# Patient Record
Sex: Male | Born: 1940 | Race: White | Hispanic: No | Marital: Married | Smoking: Former smoker
Health system: Southern US, Community
[De-identification: ages and names within clinical notes are randomized; demographics above are authoritative.]

## PROBLEM LIST (undated history)

## (undated) DIAGNOSIS — F0390 Unspecified dementia without behavioral disturbance: Secondary | ICD-10-CM

## (undated) DIAGNOSIS — E785 Hyperlipidemia, unspecified: Secondary | ICD-10-CM

## (undated) DIAGNOSIS — K219 Gastro-esophageal reflux disease without esophagitis: Secondary | ICD-10-CM

## (undated) DIAGNOSIS — F0280 Dementia in other diseases classified elsewhere without behavioral disturbance: Secondary | ICD-10-CM

## (undated) DIAGNOSIS — M1990 Unspecified osteoarthritis, unspecified site: Secondary | ICD-10-CM

## (undated) DIAGNOSIS — Z87891 Personal history of nicotine dependence: Secondary | ICD-10-CM

## (undated) DIAGNOSIS — I2510 Atherosclerotic heart disease of native coronary artery without angina pectoris: Secondary | ICD-10-CM

## (undated) DIAGNOSIS — G709 Myoneural disorder, unspecified: Secondary | ICD-10-CM

## (undated) DIAGNOSIS — I2119 ST elevation (STEMI) myocardial infarction involving other coronary artery of inferior wall: Secondary | ICD-10-CM

## (undated) HISTORY — DX: Atherosclerotic heart disease of native coronary artery without angina pectoris: I25.10

## (undated) HISTORY — PX: BELPHAROPTOSIS REPAIR: SHX369

## (undated) HISTORY — DX: Personal history of nicotine dependence: Z87.891

## (undated) HISTORY — DX: Hyperlipidemia, unspecified: E78.5

## (undated) HISTORY — PX: TONSILLECTOMY: SUR1361

## (undated) HISTORY — DX: ST elevation (STEMI) myocardial infarction involving other coronary artery of inferior wall: I21.19

---

## 2007-05-04 HISTORY — PX: CARDIAC CATHETERIZATION: SHX172

## 2007-06-04 HISTORY — PX: BACK SURGERY: SHX140

## 2007-06-28 ENCOUNTER — Observation Stay (HOSPITAL_COMMUNITY): Admission: RE | Admit: 2007-06-28 | Discharge: 2007-06-28 | Payer: Self-pay | Admitting: Neurosurgery

## 2007-09-06 ENCOUNTER — Inpatient Hospital Stay (HOSPITAL_COMMUNITY): Admission: EM | Admit: 2007-09-06 | Discharge: 2007-09-11 | Payer: Self-pay | Admitting: Emergency Medicine

## 2007-09-06 ENCOUNTER — Ambulatory Visit: Payer: Self-pay | Admitting: *Deleted

## 2007-09-22 ENCOUNTER — Encounter (HOSPITAL_COMMUNITY): Admission: RE | Admit: 2007-09-22 | Discharge: 2007-12-21 | Payer: Self-pay | Admitting: Cardiology

## 2007-10-04 ENCOUNTER — Inpatient Hospital Stay (HOSPITAL_COMMUNITY): Admission: AD | Admit: 2007-10-04 | Discharge: 2007-10-05 | Payer: Self-pay | Admitting: Cardiology

## 2007-12-20 ENCOUNTER — Encounter: Admission: RE | Admit: 2007-12-20 | Discharge: 2007-12-20 | Payer: Self-pay | Admitting: Neurosurgery

## 2007-12-22 ENCOUNTER — Encounter (HOSPITAL_COMMUNITY): Admission: RE | Admit: 2007-12-22 | Discharge: 2008-01-29 | Payer: Self-pay | Admitting: Cardiology

## 2008-04-12 ENCOUNTER — Encounter: Admission: RE | Admit: 2008-04-12 | Discharge: 2008-04-12 | Payer: Self-pay | Admitting: Neurosurgery

## 2010-01-07 ENCOUNTER — Ambulatory Visit: Payer: Self-pay | Admitting: Cardiology

## 2010-01-09 IMAGING — CR DG CHEST 1V PORT
1 series · 1 of 1 positions shown · non-contrast
Comparison: None available

CLINICAL DATA: Myocardial infarction

PORTABLE CHEST - 1 VIEW

[view not recorded]
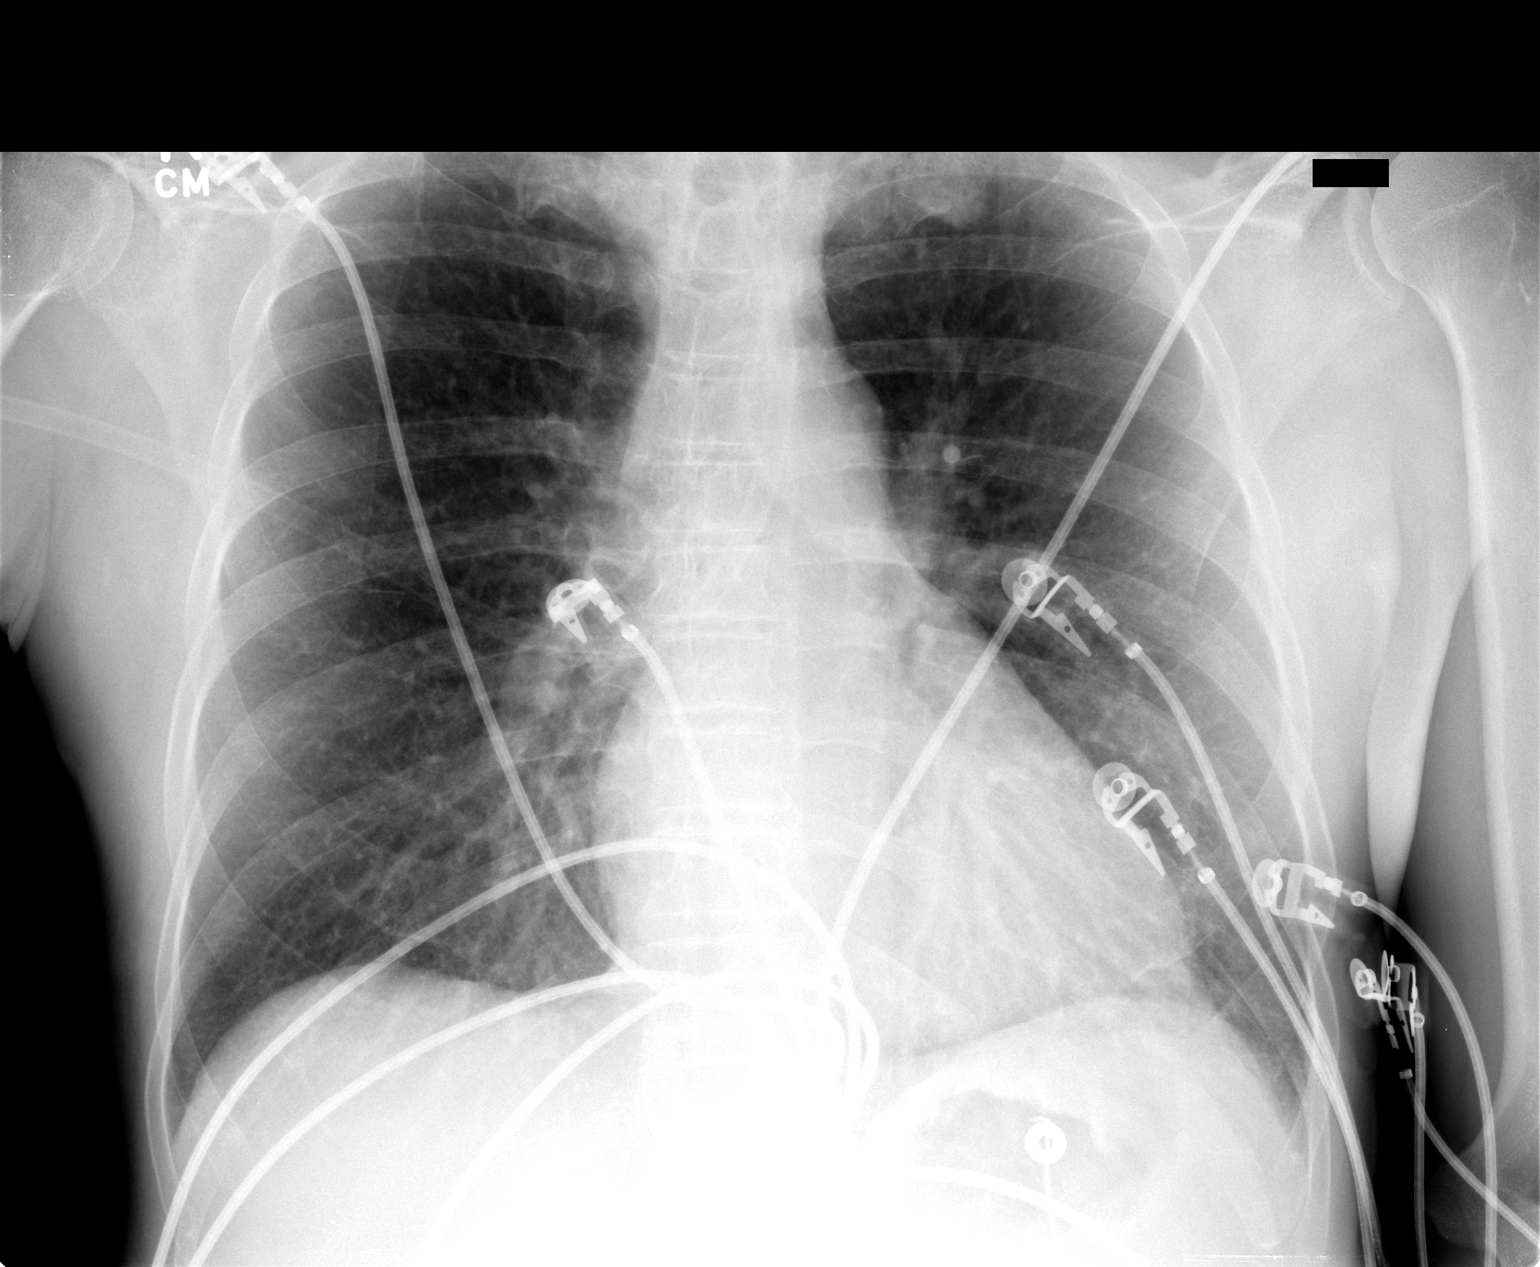

[1 of 1 positions shown; findings below may reference images not displayed]

FINDINGS: Lungs clear.  Heart size and pulmonary vascularity
normal.  No effusion.  Visualized bones unremarkable.]
IMPRESSION: [1.  No acute disease

## 2010-07-08 ENCOUNTER — Ambulatory Visit (INDEPENDENT_AMBULATORY_CARE_PROVIDER_SITE_OTHER): Payer: 59 | Admitting: Cardiology

## 2010-07-08 DIAGNOSIS — I2510 Atherosclerotic heart disease of native coronary artery without angina pectoris: Secondary | ICD-10-CM

## 2010-07-21 ENCOUNTER — Ambulatory Visit (HOSPITAL_COMMUNITY): Payer: 59 | Attending: Cardiology | Admitting: Radiology

## 2010-07-21 VITALS — Ht 68.0 in | Wt 152.0 lb

## 2010-07-21 DIAGNOSIS — I2510 Atherosclerotic heart disease of native coronary artery without angina pectoris: Secondary | ICD-10-CM | POA: Insufficient documentation

## 2010-07-21 NOTE — Progress Notes (Deleted)
[facility] SITE 3 NUCLEAR MED [address] [place] [address] [phone]  Cardiology Nuclear Med Study Alexander [name] male February 15, 1941   Nuclear Med Background Indication for Stress Test:  {CHL INDICATION STRESS TEST:21021011} History:  {CHL HISTORY STRESS TEST:21021012} Cardiac Risk Factors: {CHL CARDIAC RISK FACTORS STRESS TEST:21021014}  Symptoms:  {CHL SYMPTOMS STRESS TEST:21021013}   Nuclear Pre-Procedure Caffeine/Decaff Intake:  {CHL CAFFEINE/DECAFF INTAKE NUC:21021108} NPO After: {CHL AMB NPO TIMES:21021015}   Lungs:  *** IV 0.9% NS with Angio Cath:  {CHL IV 0.9% WITH ANGIO CATH NUC:21021042}  IV Site: {CHL IV SITE STRESS TEST:21021077}  IV Started by:  {CHL LB STRESS TEST IV STARTED BY:21021016}  Chest Size (in):  *** Cup Size:   ***  Height: 5\' 8"  (1.727 m)  Weight:  152 lb (68.947 kg)  BMI:  Body mass index is 23.11 kg/(m^2). Tech Comments:  ***    Nuclear Med Study 1 or 2 day study: 1 day  Stress Test Type:  Treadmill/Lexiscan  Reading MD: Olga Millers, MD  Order Authorizing Provider:  Roger Shelter, MD  Resting Radionuclide: Technetium 68m Tetrofosmin  Resting Radionuclide Dose: *** mCi   Stress Radionuclide:  Technetium 100m Tetrofosmin  Stress Radionuclide Dose: *** mCi           Stress Protocol Rest HR: *** Stress HR: ***  Rest BP: *** Stress BP: ***  Exercise Time:  *** min METS: ***  Predicted HR: *** % of Maximum: ***        Dose of Adenosine:  *** mg Dose of Lexiscan:  *** mg  Dose of Atropine:  *** mg Dose of Dobutamine:  *** mcg/kg/min (at max HR)  Stress Test Technologist: {CHL LB STRESS TEST TECHNOLOGIST:21021024}  Nuclear Technologist:  {CHL LB NUCLEAR TECHNOLOGIST:21021025}     Rest Procedure:  {CHL REST PROCEDURE NUCLEAR:21021027} Rest ECG: {CHL REST NFA:21308}  Stress Procedure:  {CHL STRESS PROCEDURE NUCLEAR:21021028} Stress ECG: {CHL CAR STRESS ECG:21561}  QPS Raw Data Images:  {CHL RAW DATA IMAGES  NUC:21021029} Stress Images:  {CHL STRESS IMAGES NUC:21021030} Rest Images:  {CHL REST IMAGES NUC:21021031} Subtraction (SDS):  {CHL SUBTRACTION (SDS) NUC:21021032}  Findings Risk Category:  {CHL RISK CATEGORY NUC:21021033} Clinically Abnormal:  {CHL FINDINGS CLINICALLY ABNORMAL NUC:21021035} Ischemia:  {CHL ISCHEMIA NUC:21021043} Fixed Defect:  {CHL FIXED DEFECT NUC:21021044} LV Dysfunction:  {CHL LV DYSFUNCTION NUC:21021045} Transient Ischemic Dilatation (Normal <1.22):  *** Lung/Heart Ratio (Normal <0.45):  ***  Quantitative Gated Spect Images QGS EDV:  *** ml QGS ESV:  *** ml QGS cine images:  *** QGS EF:  *** %  Impression Exercise Capacity:  {CHL EXERCISE CAPACITY NUC:21021037} BP Response:  {CHL BP RESPONSE NUC:21021038} Clinical Symptoms:  {CHL CLINICAL SYMPTOMS NUC:21021039} ECG Impression:  {CHL ECG IMPRESSION NUC:21021040} Comparison with Prior Nuclear Study: {CHL NUCLEAR STUDY COMPARISON:21562}  Overall Impression:  {CHL OVERALL IMPRESSION NUC:21021041}

## 2010-07-21 NOTE — Progress Notes (Signed)
[facility] SITE 3 NUCLEAR MED [address] 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  O2 Sat 98% on RA. IV 0.9% NS with Angio Cath:  18g  IV Site: R Antecubital  IV Started by:  Stanton Kidney, EMT-P  Chest Size (in):  40 Cup Size:  NA  Height: 5\' 8"  (1.727 m)  Weight:  152 lb (68.947 kg)  BMI:  Body mass index is 23.11 kg/(m^2). Tech Comments: Metoprolol held this am, per patient.    Nuclear Med Study 1 or 2 day study: 1 day  Stress Test Type:  Treadmill/Lexiscan  Reading MD: Charlton Haws, MD  Order Authorizing Provider:  Roger Shelter, MD  Resting Radionuclide: Technetium 72m Tetrofosmin  Resting Radionuclide Dose: 11 mCi   Stress Radionuclide:  Technetium 29m Tetrofosmin  Stress Radionuclide Dose: 33 mCi           Stress Protocol Rest HR: 66 Stress HR:120  Rest BP:133/85 Stress BP: 126/84  Exercise Time:  2 min METS: NA  Predicted HR: NA % of Maximum: NA    Predicted Max HR: 152 bpm % Max HR: 78.95 bpm Rate Pressure Product: 29937    Dose of Adenosine:  NA Dose of Lexiscan:  0.4 mg  Dose of Atropine:  NA Dose of Dobutamine:  NA  Stress Test Technologist: [employer], CMA-N  Nuclear Technologist:  Domenic Polite, CNMT     Rest Procedure:  Myocardial perfusion imaging was performed at rest 45 minutes following the intravenous administration of Technetium 4m Tetrofosmin. Rest ECG: No acute changes.  Stress Procedure:  The patient received IV Lexiscan 0.4 mg over 15-seconds with concurrent low level exercise and then  Technetium 63m Tetrofosmin was injected at 30-seconds.  There were no significant changes with Lexiscan; only occasional PVC's.  He did c/o chest pressure.  Quantitative spect images were obtained after a 45-minute delay. Stress ECG: No significant change from baseline ECG  QPS Raw Data Images:  Motion artifact Stress Images:  There is decreased uptake in the inferior wall. Rest Images:  There is decreased uptake in the inferior wall. Subtraction (SDS):  Normal  Findings Risk Category:  Low risk nuclear study. Clinically Abnormal:  Chest Pain Ischemia:  No Fixed Defect:  Small inferobasal wall infarct with no ischemia LV Dysfunction:  No Transient Ischemic Dilatation (Normal <1.22):  .96 Lung/Heart Ratio (Normal <0.45):  .28  Quantitative Gated Spect Images QGS EDV:  74 ml QGS ESV:  28ml QGS cine images:  No wall motion abnormalities QGS EF:  62 %  Impression Exercise Capacity:  Lexiscan with low level exercise. BP Response:  Normal blood pressure response. Clinical Symptoms:  Significant chest pain. ECG Impression:  No significant ST segment change suggestive of ischemia. Comparison with Prior Nuclear Study: Old study not available  Overall Impression:  Low risk stress nuclear study.

## 2010-08-12 ENCOUNTER — Telehealth: Payer: Self-pay | Admitting: *Deleted

## 2010-08-12 NOTE — Telephone Encounter (Signed)
Pt notified of Lori's instructions to take only 1/2 tablet of Crestor 20 mg and f/u in a few months with Dr. Elease Hashimoto.  Pt will call if symptoms worsen.

## 2010-08-12 NOTE — Telephone Encounter (Signed)
He may cut back to just 1/2 tablet if he would like for the next couple of months.

## 2010-08-12 NOTE — Telephone Encounter (Signed)
Pt c/o to RN of having more memory problems and pt feels like it may be r/t Crestor 20 mg.  Pt would like to know what to do?

## 2010-08-18 ENCOUNTER — Other Ambulatory Visit (HOSPITAL_COMMUNITY): Payer: Self-pay | Admitting: Neurosurgery

## 2010-08-18 DIAGNOSIS — M545 Low back pain, unspecified: Secondary | ICD-10-CM

## 2010-08-18 DIAGNOSIS — M4310 Spondylolisthesis, site unspecified: Secondary | ICD-10-CM

## 2010-08-18 DIAGNOSIS — M48061 Spinal stenosis, lumbar region without neurogenic claudication: Secondary | ICD-10-CM

## 2010-08-18 DIAGNOSIS — IMO0002 Reserved for concepts with insufficient information to code with codable children: Secondary | ICD-10-CM

## 2010-08-21 ENCOUNTER — Ambulatory Visit (HOSPITAL_COMMUNITY)
Admission: RE | Admit: 2010-08-21 | Discharge: 2010-08-21 | Disposition: A | Discharge status: Home / Self Care | Payer: Medicare Other | Source: Ambulatory Visit | Attending: Neurosurgery | Admitting: Neurosurgery

## 2010-08-21 DIAGNOSIS — M519 Unspecified thoracic, thoracolumbar and lumbosacral intervertebral disc disorder: Secondary | ICD-10-CM | POA: Insufficient documentation

## 2010-08-21 DIAGNOSIS — M4310 Spondylolisthesis, site unspecified: Secondary | ICD-10-CM

## 2010-08-21 DIAGNOSIS — M545 Low back pain, unspecified: Secondary | ICD-10-CM | POA: Insufficient documentation

## 2010-08-21 DIAGNOSIS — M48061 Spinal stenosis, lumbar region without neurogenic claudication: Secondary | ICD-10-CM | POA: Insufficient documentation

## 2010-08-21 DIAGNOSIS — IMO0002 Reserved for concepts with insufficient information to code with codable children: Secondary | ICD-10-CM

## 2010-08-21 DIAGNOSIS — M5126 Other intervertebral disc displacement, lumbar region: Secondary | ICD-10-CM | POA: Insufficient documentation

## 2010-08-21 LAB — CREATININE, SERUM
Creatinine, Ser: 0.95 mg/dL (ref 0.4–1.5)
GFR calc Af Amer: >60 mL/min (ref >60)
GFR calc non Af Amer: >60 mL/min (ref >60)

## 2010-08-21 LAB — BUN: BUN: 13 mg/dL (ref 6–23)

## 2010-08-21 MED ORDER — GADOBENATE DIMEGLUMINE 529 MG/ML IV SOLN
15.0000 mL | Freq: Once | INTRAVENOUS | Status: AC
  Administered 2010-08-21: 15 mL via INTRAVENOUS

## 2010-08-28 ENCOUNTER — Telehealth: Payer: Self-pay | Admitting: Cardiology

## 2010-08-28 NOTE — Telephone Encounter (Signed)
Is having surgery on wed. 5/2 at [facility] for L 5 ruputed disk

## 2010-08-28 NOTE — Telephone Encounter (Signed)
Dr. Deborah Chalk notified of upcoming back surgery and states pt is ok for back surgery.  Pt notified.

## 2010-08-28 NOTE — Telephone Encounter (Signed)
Pt is ok for surgery per Dr. Deborah Chalk.

## 2010-09-01 ENCOUNTER — Encounter (HOSPITAL_COMMUNITY)
Admission: RE | Admit: 2010-09-01 | Discharge: 2010-09-01 | Disposition: A | Discharge status: Home / Self Care | Payer: Medicare Other | Source: Ambulatory Visit | Attending: Neurosurgery | Admitting: Neurosurgery

## 2010-09-01 ENCOUNTER — Ambulatory Visit (HOSPITAL_COMMUNITY)
Admission: RE | Admit: 2010-09-01 | Discharge: 2010-09-01 | Disposition: A | Discharge status: Home / Self Care | Payer: Medicare Other | Source: Ambulatory Visit | Attending: Neurosurgery | Admitting: Neurosurgery

## 2010-09-01 ENCOUNTER — Other Ambulatory Visit (HOSPITAL_COMMUNITY): Payer: Self-pay | Admitting: Neurosurgery

## 2010-09-01 DIAGNOSIS — Z01810 Encounter for preprocedural cardiovascular examination: Secondary | ICD-10-CM | POA: Insufficient documentation

## 2010-09-01 DIAGNOSIS — Z87891 Personal history of nicotine dependence: Secondary | ICD-10-CM | POA: Insufficient documentation

## 2010-09-01 DIAGNOSIS — Z01818 Encounter for other preprocedural examination: Secondary | ICD-10-CM | POA: Insufficient documentation

## 2010-09-01 DIAGNOSIS — IMO0002 Reserved for concepts with insufficient information to code with codable children: Secondary | ICD-10-CM

## 2010-09-01 DIAGNOSIS — Z01812 Encounter for preprocedural laboratory examination: Secondary | ICD-10-CM | POA: Insufficient documentation

## 2010-09-01 DIAGNOSIS — I10 Essential (primary) hypertension: Secondary | ICD-10-CM | POA: Insufficient documentation

## 2010-09-01 LAB — BASIC METABOLIC PANEL
BUN: 19 mg/dL (ref 6–23)
CO2: 29 mEq/L (ref 19–32)
Calcium: 10 mg/dL (ref 8.4–10.5)
Chloride: 103 mEq/L (ref 96–112)
Creatinine, Ser: 0.96 mg/dL (ref 0.4–1.5)
GFR calc Af Amer: >60 mL/min (ref >60)
GFR calc non Af Amer: >60 mL/min (ref >60)
Glucose, Bld: 102 mg/dL — ABNORMAL HIGH (ref 70–99)
Potassium: 4.4 mEq/L (ref 3.5–5.1)
Sodium: 139 mEq/L (ref 135–145)

## 2010-09-01 LAB — CBC
HCT: 45.3 % (ref 39.0–52.0)
Hemoglobin: 15.8 g/dL (ref 13.0–17.0)
MCH: 32 pg (ref 26.0–34.0)
MCHC: 34.9 g/dL (ref 30.0–36.0)
MCV: 91.9 fL (ref 78.0–100.0)
Platelets: 239 10*3/uL (ref 150–400)
RBC: 4.93 MIL/uL (ref 4.22–5.81)
RDW: 13.4 % (ref 11.5–15.5)
WBC: 6.8 10*3/uL (ref 4.0–10.5)

## 2010-09-01 LAB — SURGICAL PCR SCREEN
MRSA, PCR: NEGATIVE
Staphylococcus aureus: POSITIVE — AB

## 2010-09-02 ENCOUNTER — Inpatient Hospital Stay (HOSPITAL_COMMUNITY)
Admission: RE | Admit: 2010-09-02 | Discharge: 2010-09-03 | DRG: 491 | Disposition: A | Discharge status: Home / Self Care | Payer: Medicare Other | Source: Ambulatory Visit | Attending: Neurosurgery | Admitting: Neurosurgery

## 2010-09-02 ENCOUNTER — Inpatient Hospital Stay (HOSPITAL_COMMUNITY): Payer: Medicare Other

## 2010-09-02 DIAGNOSIS — M51379 Other intervertebral disc degeneration, lumbosacral region without mention of lumbar back pain or lower extremity pain: Secondary | ICD-10-CM | POA: Diagnosis present

## 2010-09-02 DIAGNOSIS — Z01810 Encounter for preprocedural cardiovascular examination: Secondary | ICD-10-CM

## 2010-09-02 DIAGNOSIS — I252 Old myocardial infarction: Secondary | ICD-10-CM

## 2010-09-02 DIAGNOSIS — I10 Essential (primary) hypertension: Secondary | ICD-10-CM | POA: Diagnosis present

## 2010-09-02 DIAGNOSIS — Z87891 Personal history of nicotine dependence: Secondary | ICD-10-CM

## 2010-09-02 DIAGNOSIS — M47817 Spondylosis without myelopathy or radiculopathy, lumbosacral region: Secondary | ICD-10-CM | POA: Diagnosis present

## 2010-09-02 DIAGNOSIS — I2510 Atherosclerotic heart disease of native coronary artery without angina pectoris: Secondary | ICD-10-CM | POA: Diagnosis present

## 2010-09-02 DIAGNOSIS — M5126 Other intervertebral disc displacement, lumbar region: Principal | ICD-10-CM | POA: Diagnosis present

## 2010-09-02 DIAGNOSIS — Z9861 Coronary angioplasty status: Secondary | ICD-10-CM

## 2010-09-02 DIAGNOSIS — M5137 Other intervertebral disc degeneration, lumbosacral region: Secondary | ICD-10-CM | POA: Diagnosis present

## 2010-09-02 DIAGNOSIS — Z01812 Encounter for preprocedural laboratory examination: Secondary | ICD-10-CM

## 2010-09-07 NOTE — Op Note (Signed)
Alexander [name], Alexander [name]             ACCOUNT NO.:  [id number]  MEDICAL RECORD NO.:  [id number]           PATIENT TYPE:  I  LOCATION:  3534                         FACILITY:  [facility]  PHYSICIAN:  Hewitt Shorts, M.D.DATE OF BIRTH:  1941-01-04  DATE OF PROCEDURE:  09/02/2010 DATE OF DISCHARGE:                              OPERATIVE REPORT   PREOPERATIVE DIAGNOSES:  Right L5-S1 lumbar disk herniation, lumbar degenerative disk disease, lumbar spondylosis, and lumbar radiculopathy.  POSTOPERATIVE DIAGNOSES:  Right L5-S1 lumbar disk herniation, lumbar degenerative disk disease, lumbar spondylosis, and lumbar radiculopathy.  PROCEDURE:  Right L5-S1 lumbar laminotomy and microdiskectomy with microdissection and the operating microscope.  SURGEON:  Hewitt Shorts, MD.  ASSISTANT:  Hilda Lias, MD.  ANESTHESIA:  General endotracheal.  INDICATIONS:  The patient is a 70 year old man, who is status post previous right L3-4 extraforaminal microdiskectomy number of years ago. He has been having some low back difficulties, who is undergoing periodic epidural steroid injection, but he has had worsening right lumbar radicular pain over the past few months.  He was restudied with MRI scan couple of weeks ago, which showed large right L5-S1 lumbar disk herniation with significant thecal sac and nerve root compression. Decision was made to proceed with laminotomy and microdiskectomy.  DESCRIPTION OF PROCEDURE:  The patient was brought to the operating room and placed on general endotracheal anesthesia.  The patient was turned to prone position.  Lumbar region was prepped with Betadine soap solution and draped in sterile fashion.  The midline incision was infiltrated with local anesthetic with epinephrine.  X-ray was taken and the L5-S1 level identified.  The midline incision was made over the L5- S1 level and carried down through the subcutaneous tissue.  Bipolar cautery and  electrocautery was used to maintain hemostasis.  Dissection was carried down to lumbar fascia which was incised on the right side of the midline and the paraspinal muscles were dissected from the spinous process and lamina in subperiosteal fashion.  X-rays were taken and the L5-S1 interlaminar space was identified.  Then, the operating microscope was then draped and brought into the field to provide additional navigation, illumination, and visualization.  The remainder of the decompression was performed using microdissection and microsurgical technique.  Laminotomy was performed using the high-speed drill and Kerrison punches.  The edges of the bone were waxed as needed to establish hemostasis.  Medial facetectomy was performed and then we carefully removed the ligamentum flavum, identified the thecal sac and right S1 nerve root.  Ventral to the nerve root, there was a large disk fragment significantly posteriorly displacing the right S1 nerve root. It was enmeshed in ventral epidural tissues.  These were opened and the disk herniation removed in a piecemeal fashion.  There is a large disk herniation and we were able to remove all of the loose fragments with good decompression of the thecal sac and nerve root.  We explored the epidural space thoroughly.  All loose fragments of disk material had been successfully removed.  Epidural bleeding was controlled with bipolar cautery as needed, and although we saw a small rent in the inferior aspect of  the L5-S1 annulus, it was not felt that there was any significant disk bulging, and therefore there was no advantage to proceeding to enter into the disk space and continued diskectomy in the disk space, and so good decompression had been achieved by removing the herniated fragments.  It was irrigated with bacitracin solution. Hemostasis was established and confirmed.  We examined the epidural space another time.  No further fragments were found and  then we instilled 2 mL of fentanyl and 80 mg of Depo-Medrol into the epidural space and proceed with closure.  The deep fascia closed with interrupted undyed #1 Vicryl sutures.  Scarpa fascia was closed with interrupted undyed #1 Vicryl sutures.  The subcutaneous and subcuticular were closed with interrupted inverted 2-0 running Vicryl sutures.  The skin was approximated with Dermabond.  The procedure was tolerated well.  The estimated blood loss was 25 mL.  Sponge and needle count were correct. Following surgery, the patient was turned back to supine position to be reversed from the anesthetic, extubated, and transferred to the recovery room for further care.     Hewitt Shorts, M.D.     RWN/MEDQ  D:  09/02/2010  T:  09/03/2010  Job:  [id number]  Electronically Signed by Shirlean Kelly M.D. on 09/07/2010 12:08:06 PM

## 2010-09-15 NOTE — Op Note (Signed)
NAMEBRYSE, BLANCHETTE NO.:  [id number]   MEDICAL RECORD NO.:  [id number]          PATIENT TYPE:  INP   LOCATION:  3599                         FACILITY:  [facility]   PHYSICIAN:  Hewitt Shorts, M.D.DATE OF BIRTH:  1941/03/03   DATE OF PROCEDURE:  06/28/2007  DATE OF DISCHARGE:                               OPERATIVE REPORT   PREOPERATIVE DIAGNOSES:  1. Right L3-4 extraforaminal lumbar disk herniation.  2. Lumbar spondylosis.  3. Lumbar degenerative disk disease and lumbar radiculopathy.   POSTOPERATIVE DIAGNOSES:  1. Right L3-4 extraforaminal lumbar disk herniation.  2. Lumbar spondylosis.  3. Lumbar degenerative disk disease and lumbar radiculopathy.   PROCEDURE:  Right L3-4 extraforaminal microdiskectomy with  microdissection.   SURGEON:  Hewitt Shorts, M.D.   ASSISTANT:  Danae Orleans. Venetia Maxon, M.D.   ANESTHESIA:  General endotracheal.   INDICATIONS:  The patient is a 70 year old man who presented with right  lumbar radiculopathy secondary to a large right L3-4 extraforaminal disk  herniation.  Decision was made to proceed with elective diskectomy.   PROCEDURE PERFORMED:  The patient was brought to the operating room and  placed under general endotracheal anesthesia.  The patient was turned to  a prone position.  Lumbar region was prepped with Betadine soap and  solution and draped in a sterile fashion.  The midline was infiltrated  with local anesthetic with epinephrine and x-ray was taken.  The L3-4  level was identified.  A midline incision was made over the L3-4 level  and carried down through the subcutaneous tissue.  Bipolar cautery and  electrocautery were used to maintain hemostasis.  Dissection was carried  down to the lumbar fascia, which was incised on the right side of the  midline.  The paraspinal muscles were dissected from the spinous process  and lamina in a subperiosteal fashion.  Dissection was carried out  laterally over the facets.   A self-retaining retractor was placed.  An  an x-ray was taken and we identified the L3-4 extraforaminal space.  The  microscope was draped and brought to the field to provide additional  navigation, illumination, and visualization.  The remainder of the  decompression was performed using microdissection and microsurgical  technique.  We carefully removed the intertransverse muscle and then  fascia.  There was substantial hypertrophic overgrowth of the facet  component at L2-3 and L3-4 and using the XMax drill we removed some of  this hypertrophic bone and we were able to dissect medially to remove  the intertransverse ligament.  We identified what we suspected was the  nerve root.  It appeared to be bulging towards [place].  We worked  inferomedial to the nerve root and identified the disk herniation and  began to remove this in a piecemeal fashion.  Several large fragments  were removed and with that, the nerve roots no longer appeared  compressed and displaced and was much more loose and slack.  We  continued to explore the ventral aspect of the extraforaminal space as  well as more medially into the lateral aspect of the nerve root foramen  and all  loose fragments of disk material were removed.  We did not enter  into the disk space itself, but just removed the free fragments of the  disk that has herniated and compressed the nerve root.  All loose  fragments were removed and good decompression of the nerve root was  achieved.  We then established hemostasis with the use of Gelfoam soaked  with thrombin.  The Gelfoam was removed prior to closure.  Once  hemostasis had been established, we instilled 2 mL of fentanyl and 80 mg  of Depo-Medrol into the extraforaminal space around the nerve root and  dorsal root ganglion and then proceeded with closure.  The deep fascia  was closed with interrupted, undyed #1 Vicryl suture.  The subcutaneous  and subcuticular layers closed with interrupted  inverted 2-0 and 3-0  undyed Vicryl sutures.  The skin was re-approximated with Dermabond.  Estimated blood loss was 10 mL.  The needle count was correct.  The  sponge count was correct.  However, the cottonoid patty count was  incorrect.  We had the correct number of 1 x 1 patties and 1/2 x 1/2  patties, but had one missing 1/2 x 1 patty.  We thoroughly examined the  draped out field and tables.  We did find one cottonoid patty string and  we found a 1/2 x 1 patty on the floor although we could not actually  confirm that it was from this case.  We, therefore, did AP and lateral  lumbar spine x-rays and examined around the surgical site.  On the x-  ray, no evidence of radiodense foreign body was seen.  The x-rays were  reviewed by Dr. Amil Amen from [facility] as well and  he similarly found no evidence of a radiodense foreign body and  therefore, we proceeded with reversing the patient and awakening him  from anesthetic.  He was turned to a supine position, reversed from the  anesthetic, extubated, and transferred to the recovery room for further  care.   Notably during the case, I had only used two 1 x 1 patties in the  surgical field during the case and between the x-rays and this fact, we  do not believe that there is a retained cottonoid patty in the patient.      Hewitt Shorts, M.D.  Electronically Signed     RWN/MEDQ  D:  06/28/2007  T:  06/28/2007  Job:  [id number]

## 2010-09-15 NOTE — H&P (Signed)
NAMEJANI, Alexander [name] NO.:  [id number]   MEDICAL RECORD NO.:  [id number]          PATIENT TYPE:  INP   LOCATION:  1824                         FACILITY:  [facility]   PHYSICIAN:  Unice Cobble, MD     DATE OF BIRTH:  03/27/1941   DATE OF ADMISSION:  09/06/2007  DATE OF DISCHARGE:                              HISTORY & PHYSICAL   CHIEF COMPLAINT:  Chest pain.   HISTORY OF THE PRESENT ILLNESS:  This is a 70 year old white male with a  history of hyperlipidemia who had the onset of chest pain 30 minutes  prior to arrival associated with diaphoresis, nausea and shortness of  breath.  The pain radiated down his left arm.  Three sublingual  nitroglycerin and aspirin were given the ambulance with mild benefit.  EKG showed ST elevation inferiorly and the pain is now 3-4/10 in the  emergency room.   PAST MEDICAL AND SURGICAL HISTORY:  1. Hyperlipidemia.  2. Back surgery in February 2009.   ALLERGIES:  No known drug allergies.   MEDICATIONS:  None.   SOCIAL HISTORY:  The patient smokes less than a pack/day, but has smoked  up to a pack per day for years with numerous quittings; he last smoked 3  months ago.  No alcohol or drugs.   FAMILY HISTORY:  The patient's mother had no heart disease.  His father  had a heart attack at the age of 41.   REVIEW OF SYSTEMS:  Complete review of systems is done and found to be  otherwise negative, except as stated in the HPI.   PHYSICAL EXAMINATION:  VITAL SIGNS:  Temperature; he is afebrile.  Pulse  is 38 increasing to 81 with 0.5 mg of atropine.  Respiratory rate is 20.  Blood pressure is 90/48.  Oxygen saturations are 100% on 100% face mask.  GENERAL APPEARANCE:  In general he is a thin white male in no acute  distress.  HEENT:  PERRLA.  EOMI.  Moist mucous membranes.  Oropharynx is without  erythema or exudates.  NECK:  The neck is supple without lymphadenopathy, thyromegaly, bruits  or jugular venous distention.  HEART:  The  heart has a regular rate and rhythm without murmurs, gallops  or rubs.  Normal S1 and S2.  LUNGS:  The lungs are clear to auscultation bilaterally without wheezes,  rhonchi or rales.  ABDOMEN:  The abdomen is soft and nontender with normal bowel sounds,  and without guarding.  No hepatosplenomegaly.  EXTREMITIES:  The extremities show no cyanosis, clubbing or edema.  MUSCULOSKELETAL:  The musculoskeletal examination is normal.  NEUROLOGIC EXAMINATION:  Neurologically he is alert and oriented x3 with  no gross deficits.   LABORATORY DATA:  The EKG shows a rate of 38 in sinus bradycardia with 2-  3 mm of ST elevation inferiorly with ST depression and T wave inversions  in 1 and aVL.   ASSESSMENT AND PLAN:  This is a 70 year old white male with inferior ST  elevation myocardial infarction; will urgently go to the catheterization  laboratory.  Atropine as needed for bradycardia.  Heparin 5,000  units  was given.  Aspirin 324 given.  Plavix and 2B3A inhibitor to be  determined in the catheterization laboratory.  Check lipids.      Unice Cobble, MD  Electronically Signed     ACJ/MEDQ  D:  09/06/2007  T:  09/06/2007  Job:  [id number]

## 2010-09-15 NOTE — Cardiovascular Report (Signed)
NAMEMAYUR, [name] NO.:  [id number]   MEDICAL RECORD NO.:  [id number]          PATIENT TYPE:  INP   LOCATION:  2903                         FACILITY:  [facility]   PHYSICIAN:  Colleen Can. Deborah Chalk, M.D.DATE OF BIRTH:  13-Jul-1940   DATE OF PROCEDURE:  DATE OF DISCHARGE:                            CARDIAC CATHETERIZATION   PROCEDURE:  Left heart catheterization with selective coronary  angiography, left ventricular angiography, and percutaneous coronary  intervention with a non-drug-eluting stent to the right coronary artery.   TYPE AND SITE OF ENTRY:  Percutaneous right femoral artery with Angio-  Seal.   CATHETERS:  6-French 4-curved Judkins right and left coronary cath, 6-  French pigtail ventriculographic catheter, JR4 guide, Prowater guide  wire, initially dilated with a 3.0 x 20-mm Maverick balloon.  Subsequently returned with a 3.5 x 20-mm Liberte stent and subsequent  with a 4.0 x 15 Quantum Maverick balloon.   CONTRAST MATERIAL:  Omnipaque.   MEDICATIONS GIVEN DURING THE PROCEDURE:  Heparin, dopamine, and  subsequent Lopressor 5 mg IV.   COMMENTS:  The patient tolerated the procedure well.   HEMODYNAMIC DATA:  The LV was 105/15-25.  Aortic pressure was 115/65.   ANGIOGRAPHIC FINDINGS:  1. The main coronary artery is normal.  2. Left circumflex.  Left circumflex had irregularities of the non-      large vessel.  3. Left anterior descending had 10-20% irregularities in midportion      and then systolic bridging, left anterior descending across the      apex.  4. Intermediate.  The intermediate had somewhat diffuse 20-30%      narrowings.  5. Right coronary artery.  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  There are      left-to-right collaterals distally, although not bridged.   Left ventricular angiogram was performed after the angioplasty.  This  showed that there was mild inferior  hypokinesis.  Global ejection  fraction estimated to be a 55%.   Angioplasty procedure.  The JR-4 guide had satisfactory backup and the  Prowater guide wire easily passed across the stenosis.  We predilated  with a 3.0 x 20-mm Maverick balloon.  We then positioned a 3.5 x 20-mm  Liberte (non-drug-eluting) stent across the stenosis.  We then returned  with a 4.0 x 15-mm Quantum Maverick balloon and dilated to a maximum of  17 atmospheres.  The final angiographic resulted the area of total  occlusion was excellent.  There were some mild irregularities proximal  and distal to the stent, but is felt to be in transition and not related  to any intimal tears.  There was a large distal acute marginal vessel  that extended over to the inferior wall almost in a posterior descending  type manner that had an 80% ostial stenosis.  This may be a potential  target for revascularization at a different time.   OVERALL IMPRESSION:  1. Acute inferior myocardial infarction with successful stent placed      in the right coronary artery.  2.  Well-preserved global left ventricular function.  3. Residual 80% stenosis in an acute marginal vessel/posterior      descending vessel of the right coronary artery with mild      irregularities, otherwise, in the left coronary tree.   DISCUSSION:  The patient will be transferred to CCU.  Overall, it is  felt that the revascularization will results in myocardial salvage.      Colleen Can. Deborah Chalk, M.D.  Electronically Signed     SNT/MEDQ  D:  09/06/2007  T:  09/06/2007  Job:  [id number]   cc:   Miguel Aschoff, M.D.

## 2010-09-15 NOTE — Discharge Summary (Signed)
NAMEBECKETT, Alexander NO.:  [id number]   MEDICAL RECORD NO.:  [id number]          PATIENT TYPE:  INP   LOCATION:  6523                         FACILITY:  [facility]   PHYSICIAN:  Colleen Can. Deborah Chalk, M.D.DATE OF BIRTH:  Sep 30, 1940   DATE OF ADMISSION:  10/04/2007  DATE OF DISCHARGE:  10/05/2007                               DISCHARGE SUMMARY   DISCHARGE DIAGNOSES:  1. Coronary artery disease with subsequent successful cutting balloon      angioplasty to the posterior descending coronary artery.  2. Recent acute inferior myocardial infarction treated with stenting      of the right coronary on Sep 06, 2007 with 3.5 x 20 mm Liberte non-      drug eluting stent.  3. Recent abnormal stress Cardiolite study.  4. Hyperlipidemia.  5. History of tobacco abuse, resolved.   HISTORY OF PRESENT ILLNESS:  Mr. Gorr is a pleasant 70 year old male  who has had a recent inferior MI that was treated with emergency  stenting of the right coronary.  At that time, he had no residual lesion  in a branch portion of the posterior descending.  He has basically done  well since his discharge from the hospital on Sep 11, 2007.  He has had  a followup stress Cardiolite study which demonstrated mild residual  ischemia.  He subsequently presents for plans for attempts for further  revascularization.  Clinically, he has done well with no complaints of  chest pain.   Please see the dictated history and physical for further patient  presentation and profile.   LABORATORY DATA ON ADMISSION:  His EKG was abnormal at rest which showed  previous inferior infarction.  His PT and PTT were unremarkable.  Chemistries were normal.  CBC was normal.   HOSPITAL COURSE:  The patient was admitted electively on October 04, 2007.  He underwent cutting balloon angioplasty to the posterior descending.  Residual stenosis is less than 20%.  He received IV nitroglycerin and  Angiomax and overall the procedure was  tolerated well.  Post procedure,  he was transferred to 6500 and October 05, 2007, he is doing well without  complaints.  Physical exam is unremarkable.  Post procedure labs are  normal and he was felt to be a stable candidate for discharge.   DISCHARGE CONDITION:  Stable.   DISCHARGE DIET:  Low salt, heart-healthy.   WOUND CARE:  He is to use an ice pack specifically to the groin.   He will keep his appointment in our office for next week and certainly  sooner if any problems would arise in the interim.   DISCHARGE MEDICATIONS:  1. Plavix 75 mg a day.  2. Crestor 20 mg a day.  3. Aspirin 325 a day.  4. Lopressor 25 b.i.d.  5. Pepcid 40 at night.   He is given the okay to resume cardiac rehab on Monday, October 09, 2007.      Sharlee Blew, N.P.      Colleen Can. Deborah Chalk, M.D.  Electronically Signed    LC/MEDQ  D:  10/05/2007  T:  10/05/2007  Job:  [id number] 

## 2010-09-15 NOTE — H&P (Signed)
NAMEFOY, VANDUYNE NO.:  [id number]   MEDICAL RECORD NO.:  [id number]          PATIENT TYPE:  REC   LOCATION:  REHS                         FACILITY:  [facility]   PHYSICIAN:  Sharlee Blew, N.P.  DATE OF BIRTH:  10/20/1940   DATE OF ADMISSION:  09/22/2007  DATE OF DISCHARGE:                              HISTORY & PHYSICAL   CHIEF COMPLAINT:  None.   HISTORY OF PRESENT ILLNESS:  Mr. Mikami is very pleasant 70 year old  male who has had a recent acute inferior MI treated with stenting of the  right coronary artery.  He has a known residual lesion in a branch  portion of the posterior descending that has an approximately 80% ostial  stenosis.  He was discharge from the hospital on Sep 11, 2007.  He has  basically done well since his discharge.  He has had a followup stress  Cardiolite study in which he exercised on the standard Bruce protocol  for a total of 6 minutes and 45 seconds.  He was held in stage II.  He  was mildly short of breath with exercise.  His EKG was abnormal at rest,  but really no ischemic changes; however, his Cardiolite images showed  previous inferior infarction with mild residual ischemia.  He now  presents for repeat cardiac catheterization with plans for PCI to the  residual stenosis in the branch portion of the posterior descending.  Clinically, he has had no recurrence of chest pain.   PAST MEDICAL HISTORY:  1. Acute inferior MI treated with stenting of 3.5 x 20-mm Liberte non      drug-eluting stent to the right coronary on Sep 06, 2007.  2. Hyperlipidemia.  3. History of back surgery.   ALLERGIES:  None.   CURRENT MEDICATIONS:  1. Plavix 75 mg a day.  2. Crestor 20 mg a day.  3. Aspirin 325 a day.  4. Lopressor 25 b.i.d.  5. Pepcid 40 at bedtime.   FAMILY HISTORY:  Shows his mother has had no history of heart disease.  Father had a heart attack at age 86.   SOCIAL HISTORY:  He did smoke, but has stopped.  Since his last  admission, he has no alcohol use.  He is married.   REVIEW OF SYSTEMS:  As noted above and is otherwise unremarkable.   PHYSICAL EXAM:  GENERAL:  He is a pleasant white male who is in no acute  distress.  VITAL SIGNS:  His weight is 149 pounds, blood pressure is 102/68, heart  rate 68 and regular, respirations 18, he is afebrile.  SKIN:  Warm, dry.  Color is unremarkable.  LUNGS:  Clear.  HEART:  Shows a regular rhythm.  ABDOMEN:  Soft, positive bowel sounds, nontender.  EXTREMITIES:  Without edema.  NEUROLOGIC:  Shows no gross focal deficits.   PERTINENT LABS:  Pending.   IMPRESSION:  1. Abnormal stress Cardiolite study.  2. Recent inferior MI treated with stenting of the right coronary      artery with a residual stenosis known.  3. Hyperlipidemia.  4. Tobacco abuse, resolved.  5. History  of back surgery.   PLAN:  We will proceed on with attempts at PCI.  The procedure has been  reviewed with the patient including the risks and benefits and he is  willing to proceed on Wednesday, October 04, 2007.      Sharlee Blew, N.P.     LC/MEDQ  D:  09/29/2007  T:  09/30/2007  Job:  [id number]

## 2010-09-15 NOTE — Discharge Summary (Signed)
NAMECHESKEL, SILVERIO NO.:  [id number]   MEDICAL RECORD NO.:  [id number]          PATIENT TYPE:  INP   LOCATION:  2039                         FACILITY:  [facility]   PHYSICIAN:  Colleen Can. Deborah Chalk, M.D.DATE OF BIRTH:  29-Mar-1941   DATE OF ADMISSION:  09/06/2007  DATE OF DISCHARGE:  09/11/2007                               DISCHARGE SUMMARY   DISCHARGE DIAGNOSES:  1. Acute inferior myocardial infarction with subsequent stenting with      a 3.5 x 20 mm Liberty (non-drug-eluting) stent in the right      coronary.  2. Hyperlipidemia.  3. Prior history of back surgery.   HISTORY OF PRESENT ILLNESS:  Mr. Bray is a 70 year old white male  who has had a known history of hyperlipidemia.  He presented to the  emergency room with the onset of chest pain of 30 minutes duration that  was associated with diaphoresis, nausea, and shortness of breath.  He  had radiation down the left arm.  He was given sublingual nitroglycerin  and aspirin by EMS with only minimal benefit.  His EKG showed ST  elevation inferiorly.  Code STEMI was subsequently called.   Please see the history and physical for further patient presentation and  profile.   LABORATORY DATA:  Chest x-ray on admission showed no acute disease.  CBC  showed a hemoglobin of 12, hematocrit 36, white count of 9.9, platelets  208.  Chemistries were normal except for a glucose borderline at 100.   Peak troponin was 60.18, peak CK-MB was 275.8.   HOSPITAL COURSE:  The patient was taken emergently to the cardiac  catheterization lab.  Aspirin, Plavix, and 2b3a inhibitors were  initiated as well.  Cardiac catheterization was performed by Dr. Roger Shelter without any known complications.  Findings are as noted:  The left main coronary artery is normal.  The  left circumflex has irregularities.  The LAD has 10-20% irregularities  in the midportion and systolic bridging.  The intermediate has somewhat  diffuse 20-30%  narrowings.  The right coronary artery is a very large  vessel which is totally occluded, 4 cm from the ostium.  There are left-  to-right collaterals distally, although not bridged.  Left ventricular  angiogram was performed after the angioplasty.  Ejection fraction was  estimated to be 55%.  A 3.5 x 20 mm Liberty (non-drug-eluting) stent was  placed in the right coronary.  There is some concern about residual  stenosis in the branch portion in the posterior descending that has an  approximately 80% ostial stenosis.  This may be a potential target for  revascularization at different times.  However, the patient will  initially be followed clinically.  Postprocedure, he was transferred to  the Coronary Care Unit where he progressed rather nicely throughout the  remainder of hospitalization.  He had no recurrence of chest pain.  His  activity was increased and tolerated without problems.  By Sep 11, 2007,  he was doing well and was felt to be a satisfactory candidate for  discharge.  It will be our plans for him to have  outpatient Cardiolite  study here in the next several weeks to delineate further ischemia.   DISCHARGE CONDITION:  Stable.   DISCHARGE DIET:  Low fat.   ACTIVITIES:  Be increased as tolerated.   DISCHARGE MEDICINES:  1. Aspirin 325 mg a day.  2. Plavix 75 mg a day.  3. Metoprolol 50 mg half tablet b.i.d.  4. Pepcid 40 mg at bedtime.  5. Nitroglycerin spray p.r.n.  6. Crestor 20 mg daily.   We will plan on seeing him back in the office in 1-61 days, certainly  sooner if needed.      Sharlee Blew, N.P.      Colleen Can. Deborah Chalk, M.D.  Electronically Signed    LC/MEDQ  D:  09/11/2007  T:  09/12/2007  Job:  [id number]   cc:   Miguel Aschoff, M.D.

## 2010-11-23 ENCOUNTER — Other Ambulatory Visit: Payer: Self-pay | Admitting: Cardiovascular Disease

## 2010-11-23 ENCOUNTER — Other Ambulatory Visit: Payer: Self-pay | Admitting: *Deleted

## 2011-01-22 LAB — CBC
HCT: 44.3
Hemoglobin: 15.2
MCHC: 34.4
MCV: 94.1
Platelets: 307
RBC: 4.71
RDW: 12.8
WBC: 9.9

## 2011-01-28 LAB — CBC
HCT: 41.5
Hemoglobin: 14.2
Platelets: 237
WBC: 8.9

## 2011-01-28 LAB — BASIC METABOLIC PANEL
BUN: 16
GFR calc non Af Amer: >60
Potassium: 4
Sodium: 141

## 2011-01-28 LAB — CK TOTAL AND CKMB (NOT AT ARMC): CK, MB: 2

## 2011-01-28 LAB — TROPONIN I: Troponin I: 0.01

## 2011-03-03 ENCOUNTER — Other Ambulatory Visit (INDEPENDENT_AMBULATORY_CARE_PROVIDER_SITE_OTHER): Payer: Medicare Other | Admitting: *Deleted

## 2011-03-03 ENCOUNTER — Ambulatory Visit: Payer: Medicare Other | Admitting: Cardiovascular Disease

## 2011-03-03 ENCOUNTER — Ambulatory Visit (INDEPENDENT_AMBULATORY_CARE_PROVIDER_SITE_OTHER): Payer: Medicare Other | Admitting: Cardiovascular Disease

## 2011-03-03 ENCOUNTER — Encounter: Payer: Self-pay | Admitting: Cardiovascular Disease

## 2011-03-03 DIAGNOSIS — I10 Essential (primary) hypertension: Secondary | ICD-10-CM

## 2011-03-03 DIAGNOSIS — E785 Hyperlipidemia, unspecified: Secondary | ICD-10-CM

## 2011-03-03 DIAGNOSIS — I2510 Atherosclerotic heart disease of native coronary artery without angina pectoris: Secondary | ICD-10-CM

## 2011-03-03 LAB — BASIC METABOLIC PANEL
BUN: 22 mg/dL (ref 6–23)
CO2: 24 mEq/L (ref 19–32)
Calcium: 9.1 mg/dL (ref 8.4–10.5)
Creatinine, Ser: 1.2 mg/dL (ref 0.4–1.5)
Glucose, Bld: 98 mg/dL (ref 70–99)

## 2011-03-03 LAB — HEPATIC FUNCTION PANEL
AST: 31 U/L (ref 0–37)
Alkaline Phosphatase: 36 U/L — ABNORMAL LOW (ref 39–117)
Total Bilirubin: 0.4 mg/dL (ref 0.3–1.2)

## 2011-03-03 LAB — LIPID PANEL
HDL: 57.6 mg/dL (ref >39.00)
Total CHOL/HDL Ratio: 3

## 2011-03-03 NOTE — Progress Notes (Signed)
Alexander [name] Date of Birth  [birth year] [facility] [address]    [address] [place], [place]  [address] [phone]  Fax  [phone]  History of Present Illness:  Alexander [name] is a 70 year old gentleman with a history of coronary artery disease. Status post inferior wall myocardial infarction in 2009. He had a stent placed at that time. He is also status post cutting balloon procedure in 2009.  Has a history of hyperlipidemia.  He's done very well. He works out at [employer] on a regular basis. He's not had any episodes of chest pain or shortness of breath.  Current Outpatient Prescriptions on File Prior to Visit  Medication Sig Dispense Refill  . metoprolol (LOPRESSOR) 50 MG tablet TAKE 1/2 A TABLET TWICE A DAY  30 tablet  11    No Known Allergies  Past Medical History  Diagnosis Date  . Coronary artery disease     with subsequent successful cutting balloon angioplasty to the posterior descending coronary artery  . Acute inferior myocardial infarction      treated with stenting of the right coronary on Sep 06, 2007 with 3.5 x 20 mm Liberte non-drug eluting stent  . Hyperlipidemia   . History of tobacco abuse     Past Surgical History  Procedure Date  . Back surgery  February 2009    History  Smoking status  . Former Smoker  Smokeless tobacco  . Not on file    History  Alcohol Use No    Family History  Problem Relation Age of Onset  . Heart attack Father 69    Reviw of Systems:  Reviewed in the HPI.  All other systems are negative.  Physical Exam: BP 122/75  Pulse 63  Ht 5\' 8"  (1.727 m)  Wt 155 lb 1.9 oz (70.362 kg)  BMI 23.59 kg/m2 The patient is alert and oriented x 3.  The mood and affect are normal.   Skin: warm and dry.  Color is normal.    HEENT:    / AT. Normal carotids, no JVD  Lungs: Clear to auscultation   Heart: Regular S1-S2.    Abdomen: His abdomen is nontender. His good bowel sounds. Is no hepatosplenomegaly.  Extremities:   No clubbing cyanosis or edema.  Neuro:  The exam is nonfocal. His gait is normal.    Assessment / Plan:

## 2011-03-03 NOTE — Assessment & Plan Note (Signed)
We'll check fasting lab work today. We'll see him again in 6 months and check his fasting lab work at that time.

## 2011-03-03 NOTE — Assessment & Plan Note (Signed)
He's doing quite well from a cardiac standpoint. He's not had any episodes of chest pain or shortness breath. He works out at [employer] on a regular basis without any difficulties.  He has a current prescription for nitroglycerin. We will continue with the same medications. I'll see him again in 6 months.

## 2011-03-03 NOTE — Patient Instructions (Signed)
Your physician wants you to follow-up in: 6 months You will receive a reminder letter in the mail two months in advance. If you don't receive a letter, please call our office to schedule the follow-up appointment.   Your physician recommends that you return for lab work in: 6 months FASTING/ and will be done today, you will receive a call in 3-5 days with results.

## 2011-03-04 ENCOUNTER — Telehealth: Payer: Self-pay | Admitting: *Deleted

## 2011-03-04 NOTE — Telephone Encounter (Signed)
Message copied by Antony Odea on Thu Mar 04, 2011  4:36 PM ------      Message from: Vesta Mixer      Created: Wed Mar 03, 2011  8:07 PM       Overall OK

## 2011-03-04 NOTE — Telephone Encounter (Signed)
msg left of normal lab results 

## 2011-05-31 ENCOUNTER — Other Ambulatory Visit: Payer: Self-pay | Admitting: Cardiology

## 2011-07-22 ENCOUNTER — Other Ambulatory Visit: Payer: Self-pay | Admitting: Cardiovascular Disease

## 2011-08-30 ENCOUNTER — Ambulatory Visit (INDEPENDENT_AMBULATORY_CARE_PROVIDER_SITE_OTHER): Payer: Medicare Other | Admitting: Cardiovascular Disease

## 2011-08-30 ENCOUNTER — Encounter: Payer: Self-pay | Admitting: Cardiovascular Disease

## 2011-08-30 VITALS — BP 111/63 | HR 72 | Ht 68.0 in | Wt 153.0 lb

## 2011-08-30 DIAGNOSIS — I2510 Atherosclerotic heart disease of native coronary artery without angina pectoris: Secondary | ICD-10-CM

## 2011-08-30 DIAGNOSIS — E785 Hyperlipidemia, unspecified: Secondary | ICD-10-CM

## 2011-08-30 LAB — HEPATIC FUNCTION PANEL
AST: 29 U/L (ref 0–37)
Total Bilirubin: 0.5 mg/dL (ref 0.3–1.2)

## 2011-08-30 LAB — BASIC METABOLIC PANEL
CO2: 27 mEq/L (ref 19–32)
Calcium: 9.2 mg/dL (ref 8.4–10.5)
Glucose, Bld: 93 mg/dL (ref 70–99)
Sodium: 142 mEq/L (ref 135–145)

## 2011-08-30 LAB — LIPID PANEL
HDL: 66.1 mg/dL (ref >39.00)
Total CHOL/HDL Ratio: 3
VLDL: 15.2 mg/dL (ref 0.0–40.0)

## 2011-08-30 NOTE — Progress Notes (Signed)
[name] Date of Birth  Jul 16, 1940  [facility] [address]    [address] [place], [place]  [address] [phone]  Fax  [phone]  History of Present Illness:  Alexander [name] is a 71 year old gentleman with a history of coronary artery disease. Status post inferior wall myocardial infarction in 2009. He had a stent placed at that time. He is also status post cutting balloon procedure in 2009.  Has a history of hyperlipidemia.  He's done very well. He works out at [employer] on a regular basis. He's not had any episodes of chest pain or shortness of breath.  Current Outpatient Prescriptions on File Prior to Visit  Medication Sig Dispense Refill  . aspirin 325 MG tablet Take 325 mg by mouth daily.        . famotidine (PEPCID) 40 MG tablet TAKE 1 TABLET EACH NIGHT  30 tablet  11  . GABAPENTIN PO Take 300 mg by mouth 3 (three) times daily.       . metoprolol (LOPRESSOR) 50 MG tablet TAKE 1/2 A TABLET TWICE A DAY  30 tablet  11  . rosuvastatin (CRESTOR) 10 MG tablet Take 10 mg by mouth daily.        [name] DISCONTD: rosuvastatin (CRESTOR) 20 MG tablet Take 0.5 tablets (10 mg total) by mouth daily.  30 tablet  1    No Known Allergies  Past Medical History  Diagnosis Date  . Coronary artery disease     Stent to prox RCA 2009 with cutting balloon to PDA  . Acute inferior myocardial infarction      Stent to right coronary on Sep 06, 2007 with 3.5 x 20 mm Liberte non-drug eluting stent  . Hyperlipidemia   . History of tobacco abuse     Past Surgical History  Procedure Date  . Back surgery  February 2009    History  Smoking status  . Former Smoker  Smokeless tobacco  . Not on file    History  Alcohol Use No    Family History  Problem Relation Age of Onset  . Heart attack Father 28    Reviw of Systems:  Reviewed in the HPI.  All other systems are negative.  Physical Exam: BP 111/63  Pulse 72  Ht 5\' 8"  (1.727 m)  Wt 153 lb (69.4 kg)  BMI 23.26 kg/m2 The patient  is alert and oriented x 3.  The mood and affect are normal.   Skin: warm and dry.  Color is normal.    HEENT:   [place] / AT. Normal carotids, no JVD  Lungs: Clear to auscultation   Heart: Regular S1-S2.    Abdomen: His abdomen is nontender. His good bowel sounds. Is no hepatosplenomegaly.  Extremities:  No clubbing cyanosis or edema.  Neuro:  The exam is nonfocal. His gait is normal.    EKG: 08/30/2011-normal sinus rhythm. Normal EKG.  Assessment / Plan:

## 2011-08-30 NOTE — Assessment & Plan Note (Signed)
Alexander [name] is doing very well. He's not having episodes of chest pain or shortness breath. He's been exercising on a regular basis.  We'll continue the same medications. I'll see him again in 6 months. Will check fasting lipids at that time.

## 2011-08-30 NOTE — Patient Instructions (Signed)
Your physician wants you to follow-up in: 6 months  You will receive a reminder letter in the mail two months in advance. If you don't receive a letter, please call our office to schedule the follow-up appointment.  Your physician recommends that you return for a FASTING lipid profile: 6 months   

## 2011-08-31 ENCOUNTER — Telehealth: Payer: Self-pay | Admitting: *Deleted

## 2011-08-31 DIAGNOSIS — I2510 Atherosclerotic heart disease of native coronary artery without angina pectoris: Secondary | ICD-10-CM

## 2011-08-31 DIAGNOSIS — E785 Hyperlipidemia, unspecified: Secondary | ICD-10-CM

## 2011-08-31 MED ORDER — ROSUVASTATIN CALCIUM 20 MG PO TABS: 20.0000 mg | ORAL_TABLET | Freq: Every day | ORAL | Status: DC

## 2011-08-31 NOTE — Telephone Encounter (Signed)
Med increased, lab ordered, 3 month redraw set.

## 2011-08-31 NOTE — Telephone Encounter (Signed)
Message copied by Antony Odea on Tue Aug 31, 2011  8:37 AM ------      Message from: Vesta Mixer      Created: Mon Aug 30, 2011  6:13 PM       Is LDL is 90 - goal is <70.  Increase crestor to 20 .  Recheck labs in 3 months

## 2011-11-30 ENCOUNTER — Other Ambulatory Visit (INDEPENDENT_AMBULATORY_CARE_PROVIDER_SITE_OTHER): Payer: Medicare Other

## 2011-11-30 DIAGNOSIS — I2510 Atherosclerotic heart disease of native coronary artery without angina pectoris: Secondary | ICD-10-CM

## 2011-11-30 DIAGNOSIS — E785 Hyperlipidemia, unspecified: Secondary | ICD-10-CM

## 2011-11-30 LAB — BASIC METABOLIC PANEL
BUN: 22 mg/dL (ref 6–23)
Creatinine, Ser: 1.2 mg/dL (ref 0.4–1.5)
GFR: 64.3 mL/min (ref >60.00)
Potassium: 4.1 mEq/L (ref 3.5–5.1)

## 2011-11-30 LAB — HEPATIC FUNCTION PANEL
Albumin: 4.1 g/dL (ref 3.5–5.2)
Alkaline Phosphatase: 36 U/L — ABNORMAL LOW (ref 39–117)
Total Protein: 6.8 g/dL (ref 6.0–8.3)

## 2011-11-30 LAB — LIPID PANEL
Cholesterol: 144 mg/dL (ref 0–200)
LDL Cholesterol: 67 mg/dL (ref 0–99)
Total CHOL/HDL Ratio: 3
VLDL: 20.2 mg/dL (ref 0.0–40.0)

## 2011-12-01 ENCOUNTER — Other Ambulatory Visit: Payer: Self-pay | Admitting: *Deleted

## 2011-12-01 MED ORDER — METOPROLOL TARTRATE 50 MG PO TABS: 25.0000 mg | ORAL_TABLET | Freq: Two times a day (BID) | ORAL | Status: DC

## 2011-12-01 NOTE — Telephone Encounter (Signed)
Fax Received. Refill Completed. Frutoso Dimare Chowoe (R.M.A)   

## 2011-12-03 ENCOUNTER — Other Ambulatory Visit: Payer: Self-pay | Admitting: *Deleted

## 2011-12-03 NOTE — Telephone Encounter (Signed)
Opened in Error.

## 2012-02-01 ENCOUNTER — Other Ambulatory Visit: Payer: Self-pay | Admitting: Cardiology

## 2012-02-04 ENCOUNTER — Other Ambulatory Visit: Payer: Self-pay

## 2012-02-04 MED ORDER — NITROGLYCERIN 0.4 MG SL SUBL: 0.4000 mg | SUBLINGUAL_TABLET | SUBLINGUAL | Status: DC | PRN

## 2012-03-01 ENCOUNTER — Other Ambulatory Visit: Payer: Self-pay | Admitting: *Deleted

## 2012-03-01 DIAGNOSIS — I2510 Atherosclerotic heart disease of native coronary artery without angina pectoris: Secondary | ICD-10-CM

## 2012-03-01 DIAGNOSIS — E785 Hyperlipidemia, unspecified: Secondary | ICD-10-CM

## 2012-03-03 ENCOUNTER — Other Ambulatory Visit (INDEPENDENT_AMBULATORY_CARE_PROVIDER_SITE_OTHER): Payer: Medicare Other

## 2012-03-03 DIAGNOSIS — I2510 Atherosclerotic heart disease of native coronary artery without angina pectoris: Secondary | ICD-10-CM

## 2012-03-03 DIAGNOSIS — E785 Hyperlipidemia, unspecified: Secondary | ICD-10-CM

## 2012-03-03 LAB — BASIC METABOLIC PANEL
CO2: 28 mEq/L (ref 19–32)
Calcium: 9.1 mg/dL (ref 8.4–10.5)
Chloride: 105 mEq/L (ref 96–112)
Potassium: 3.9 mEq/L (ref 3.5–5.1)
Sodium: 139 mEq/L (ref 135–145)

## 2012-03-03 LAB — LIPID PANEL
HDL: 52.3 mg/dL (ref >39.00)
Total CHOL/HDL Ratio: 3
Triglycerides: 105 mg/dL (ref 0.0–149.0)
VLDL: 21 mg/dL (ref 0.0–40.0)

## 2012-03-03 LAB — HEPATIC FUNCTION PANEL
AST: 29 U/L (ref 0–37)
Albumin: 3.8 g/dL (ref 3.5–5.2)

## 2012-03-06 ENCOUNTER — Ambulatory Visit (INDEPENDENT_AMBULATORY_CARE_PROVIDER_SITE_OTHER): Payer: Medicare Other | Admitting: Cardiovascular Disease

## 2012-03-06 ENCOUNTER — Encounter: Payer: Self-pay | Admitting: Cardiovascular Disease

## 2012-03-06 VITALS — BP 114/76 | HR 65 | Ht 68.0 in | Wt 152.4 lb

## 2012-03-06 DIAGNOSIS — E785 Hyperlipidemia, unspecified: Secondary | ICD-10-CM

## 2012-03-06 DIAGNOSIS — I2510 Atherosclerotic heart disease of native coronary artery without angina pectoris: Secondary | ICD-10-CM

## 2012-03-06 NOTE — Assessment & Plan Note (Signed)
Alexander [name] is doing well. He has not had any episodes of angina. He exercises on a regular basis. I have advised him to continue with a good exercise program. I seen again in 6 parts. We'll check fasting lipids at that time and we'll get an EKG

## 2012-03-06 NOTE — Patient Instructions (Addendum)
Your physician wants you to follow-up in: 6 months  You will receive a reminder letter in the mail two months in advance. If you don't receive a letter, please call our office to schedule the follow-up appointment.  Your physician recommends that you return for a FASTING lipid profile: 6 months   

## 2012-03-06 NOTE — Progress Notes (Signed)
Alexander [name] Date of Birth  Sep 14, 1940  [facility] [address]    [address] [place], [place]  [address] [phone]  Fax  [phone]  Problem List 1. CAD- s/p stenting 2009 ( [facility])   History of Present Illness:  Alexander [name] is a 71 year old gentleman with a history of coronary artery disease. Status post inferior wall myocardial infarction in 2009. He had a stent placed at that time. He is also status post cutting balloon procedure in 2009.  Has a history of hyperlipidemia.  He's done very well. He works out at [employer] on a regular basis. He's not had any episodes of chest pain or shortness of breath.  He still works out at [employer].   Current Outpatient Prescriptions on File Prior to Visit  Medication Sig Dispense Refill  . aspirin 325 MG tablet Take 325 mg by mouth daily.        . famotidine (PEPCID) 40 MG tablet TAKE 1 TABLET EACH NIGHT  30 tablet  11  . GABAPENTIN PO Take 300 mg by mouth 3 (three) times daily.       . metoprolol (LOPRESSOR) 50 MG tablet Take 0.5 tablets (25 mg total) by mouth 2 (two) times daily.  60 tablet  5  . nitroGLYCERIN (NITROSTAT) 0.4 MG SL tablet Place 1 tablet (0.4 mg total) under the tongue every 5 (five) minutes as needed for chest pain.  25 tablet  5  . rosuvastatin (CRESTOR) 20 MG tablet Take 1 tablet (20 mg total) by mouth daily.  30 tablet  5    No Known Allergies  Past Medical History  Diagnosis Date  . Coronary artery disease     Stent to prox RCA 2009 with cutting balloon to PDA  . Acute inferior myocardial infarction      Stent to right coronary on Sep 06, 2007 with 3.5 x 20 mm Liberte non-drug eluting stent  . Hyperlipidemia   . History of tobacco abuse     Past Surgical History  Procedure Date  . Back surgery  February 2009    History  Smoking status  . Former Smoker  Smokeless tobacco  . Not on file    History  Alcohol Use No    Family History  Problem Relation Age of Onset  . Heart attack Father 50     Reviw of Systems:  Reviewed in the HPI.  All other systems are negative.  Physical Exam: BP 114/76  Pulse 65  Ht 5\' 8"  (1.727 m)  Wt 152 lb 6.4 oz (69.128 kg)  BMI 23.17 kg/m2  SpO2 97% The patient is alert and oriented x 3.  The mood and affect are normal.   Skin: warm and dry.  Color is normal.    HEENT:   [place] / AT. Normal carotids, no JVD  Lungs: Clear to auscultation   Heart: Regular S1-S2.    Abdomen: His abdomen is nontender. His good bowel sounds. Is no hepatosplenomegaly.  Extremities:  No clubbing cyanosis or edema.  Neuro:  The exam is nonfocal. His gait is normal.    EKG: 08/30/2011-normal sinus rhythm. Normal EKG.  Assessment / Plan:

## 2012-03-06 NOTE — Assessment & Plan Note (Signed)
His lipids are fairly well controlled. His last LDL is 78. We will continue with the current dose of Crestor and he'll continue with the diet and exercise program.

## 2012-03-10 NOTE — Telephone Encounter (Signed)
Fax Received. Refill Completed. Alexander [name] (R.M.A)   

## 2012-03-20 ENCOUNTER — Other Ambulatory Visit: Payer: Self-pay | Admitting: *Deleted

## 2012-03-20 MED ORDER — ROSUVASTATIN CALCIUM 20 MG PO TABS: 20.0000 mg | ORAL_TABLET | Freq: Every day | ORAL | Status: DC

## 2012-03-20 NOTE — Telephone Encounter (Signed)
Fax Received. Refill Completed. Alexander [name] (R.M.A)   

## 2012-03-23 ENCOUNTER — Other Ambulatory Visit: Payer: Self-pay | Admitting: *Deleted

## 2012-03-23 NOTE — Telephone Encounter (Signed)
Error

## 2012-09-01 ENCOUNTER — Encounter: Payer: Self-pay | Admitting: Cardiology

## 2012-09-18 ENCOUNTER — Encounter: Payer: Self-pay | Admitting: Cardiovascular Disease

## 2012-09-18 ENCOUNTER — Ambulatory Visit (INDEPENDENT_AMBULATORY_CARE_PROVIDER_SITE_OTHER): Payer: Medicare Other | Admitting: *Deleted

## 2012-09-18 ENCOUNTER — Ambulatory Visit (INDEPENDENT_AMBULATORY_CARE_PROVIDER_SITE_OTHER): Payer: Medicare Other | Admitting: Cardiovascular Disease

## 2012-09-18 VITALS — BP 110/76 | HR 76 | Ht 68.0 in | Wt 145.0 lb

## 2012-09-18 DIAGNOSIS — E785 Hyperlipidemia, unspecified: Secondary | ICD-10-CM

## 2012-09-18 DIAGNOSIS — I2510 Atherosclerotic heart disease of native coronary artery without angina pectoris: Secondary | ICD-10-CM

## 2012-09-18 LAB — HEPATIC FUNCTION PANEL
ALT: 17 U/L (ref 0–53)
AST: 25 U/L (ref 0–37)
Bilirubin, Direct: 0 mg/dL (ref 0.0–0.3)
Total Bilirubin: 0.6 mg/dL (ref 0.3–1.2)

## 2012-09-18 LAB — BASIC METABOLIC PANEL
BUN: 22 mg/dL (ref 6–23)
Calcium: 9.5 mg/dL (ref 8.4–10.5)
GFR: 70.25 mL/min (ref >60.00)
Glucose, Bld: 89 mg/dL (ref 70–99)
Sodium: 140 mEq/L (ref 135–145)

## 2012-09-18 NOTE — Patient Instructions (Addendum)
Your physician wants you to follow-up in: 6 Months with EKG You will receive a reminder letter in the mail two months in advance. If you don't receive a letter, please call our office to schedule the follow-up appointment.  Your physician recommends that you return for a FASTING lipid profile: bmet, hft, lipid  Your physician recommends that you continue on your current medications as directed. Please refer to the Current Medication list given to you today.

## 2012-09-18 NOTE — Progress Notes (Signed)
Alexander [name] Date of Birth  24-Nov-1940 [facility] [address]    [address] [place], [place]  [address] [phone]  Fax  [phone]  Problem List 1. CAD- s/p stenting 2009 ( [facility])   History of Present Illness:  Alexander [name] is a 72 year old gentleman with a history of coronary artery disease. Status post inferior wall myocardial infarction in 2009. He had a stent placed at that time. He is also status post cutting balloon procedure in 2009.  Has a history of hyperlipidemia.  He's done very well. He works out at [employer] on a regular basis. He's not had any episodes of chest pain or shortness of breath.  He still works out at [employer].   Sep 18, 2012:  Still working out at [employer].   He is having some memory problems and wants to decrease the dose of crestor.   Current Outpatient Prescriptions on File Prior to Visit  Medication Sig Dispense Refill  . famotidine (PEPCID) 40 MG tablet TAKE 1 TABLET EACH NIGHT  30 tablet  11  . GABAPENTIN PO Take 300 mg by mouth 2 (two) times daily.       . metoprolol (LOPRESSOR) 50 MG tablet Take 0.5 tablets (25 mg total) by mouth 2 (two) times daily.  60 tablet  5  . nitroGLYCERIN (NITROSTAT) 0.4 MG SL tablet Place 1 tablet (0.4 mg total) under the tongue every 5 (five) minutes as needed for chest pain.  25 tablet  5  . NITROLINGUAL 0.4 MG/SPRAY spray USE 1 SPRAY AS NEEDED  12 Bottle  2  . rosuvastatin (CRESTOR) 20 MG tablet Take 1 tablet (20 mg total) by mouth daily.  30 tablet  5   No current facility-administered medications on file prior to visit.    No Known Allergies  Past Medical History  Diagnosis Date  . Coronary artery disease     Stent to prox RCA 2009 with cutting balloon to PDA  . Acute inferior myocardial infarction      Stent to right coronary on Sep 06, 2007 with 3.5 x 20 mm Liberte non-drug eluting stent  . Hyperlipidemia   . History of tobacco abuse     Past Surgical History  Procedure Laterality Date  .  Back surgery   February 2009    History  Smoking status  . Former Smoker  Smokeless tobacco  . Not on file    History  Alcohol Use No    Family History  Problem Relation Age of Onset  . Heart attack Father 61    Reviw of Systems:  Reviewed in the HPI.  All other systems are negative.  Physical Exam: BP 110/76  Pulse 76  Ht 5\' 8"  (1.727 m)  Wt 145 lb (65.772 kg)  BMI 22.05 kg/m2  SpO2 98% The patient is alert and oriented x 3.  The mood and affect are normal.   Skin: warm and dry.  Color is normal.    HEENT:   [place] / AT. Normal carotids, no JVD  Lungs: Clear to auscultation   Heart: Regular S1-S2.    Abdomen: His abdomen is nontender. His good bowel sounds. Is no hepatosplenomegaly.  Extremities:  No clubbing cyanosis or edema.  Neuro:  The exam is nonfocal. His gait is normal.    EKG: 08/30/2011-normal sinus rhythm. Normal EKG.  Assessment / Plan:

## 2012-09-18 NOTE — Assessment & Plan Note (Signed)
Alexander [name] is doing very well. He's not had any episodes of chest pain. He works out at [employer] 5 days a week and is doing very well.  We've drawn his fasting lipid profile. He's having some memory problems and he would like to try decreasing the dose of his Crestor slightly. I also suggest that we may hold the Crestor for a couple of weeks to see if that improves his memory  I'll see him in 6 months. We'll check fasting lipids and EKG at that time.

## 2012-09-18 NOTE — Assessment & Plan Note (Signed)
His last lipid profile 6 months ago was well controlled. He would potentially like to decrease the dose of his Crestor because of some memory problems. We'll see what today's results look like.

## 2012-09-20 ENCOUNTER — Other Ambulatory Visit: Payer: Self-pay | Admitting: *Deleted

## 2012-09-20 MED ORDER — ROSUVASTATIN CALCIUM 20 MG PO TABS: 20.0000 mg | ORAL_TABLET | Freq: Every day | ORAL | Status: DC

## 2012-11-12 ENCOUNTER — Other Ambulatory Visit: Payer: Self-pay | Admitting: Cardiovascular Disease

## 2012-11-21 ENCOUNTER — Other Ambulatory Visit: Payer: Self-pay | Admitting: Neurosurgery

## 2012-11-21 DIAGNOSIS — M48061 Spinal stenosis, lumbar region without neurogenic claudication: Secondary | ICD-10-CM

## 2012-11-24 ENCOUNTER — Ambulatory Visit
Admission: RE | Admit: 2012-11-24 | Discharge: 2012-11-24 | Disposition: A | Discharge status: Home / Self Care | Payer: Medicare Other | Source: Ambulatory Visit | Attending: Neurosurgery | Admitting: Neurosurgery

## 2012-11-24 DIAGNOSIS — M48061 Spinal stenosis, lumbar region without neurogenic claudication: Secondary | ICD-10-CM

## 2012-11-24 MED ORDER — GADOBENATE DIMEGLUMINE 529 MG/ML IV SOLN
14.0000 mL | Freq: Once | INTRAVENOUS | Status: AC | PRN
  Administered 2012-11-24: 14 mL via INTRAVENOUS

## 2013-02-16 ENCOUNTER — Other Ambulatory Visit: Payer: Self-pay

## 2013-02-16 ENCOUNTER — Other Ambulatory Visit: Payer: Medicare Other

## 2013-02-16 MED ORDER — NITROGLYCERIN 0.4 MG SL SUBL: 0.4000 mg | SUBLINGUAL_TABLET | SUBLINGUAL | Status: DC | PRN

## 2013-03-19 ENCOUNTER — Encounter (INDEPENDENT_AMBULATORY_CARE_PROVIDER_SITE_OTHER): Payer: Self-pay

## 2013-03-19 ENCOUNTER — Ambulatory Visit (INDEPENDENT_AMBULATORY_CARE_PROVIDER_SITE_OTHER): Payer: Medicare Other | Admitting: Cardiovascular Disease

## 2013-03-19 ENCOUNTER — Encounter: Payer: Self-pay | Admitting: Cardiovascular Disease

## 2013-03-19 ENCOUNTER — Other Ambulatory Visit: Payer: Medicare Other

## 2013-03-19 VITALS — BP 120/78 | HR 68 | Ht 68.0 in | Wt 148.0 lb

## 2013-03-19 DIAGNOSIS — I2510 Atherosclerotic heart disease of native coronary artery without angina pectoris: Secondary | ICD-10-CM

## 2013-03-19 DIAGNOSIS — E785 Hyperlipidemia, unspecified: Secondary | ICD-10-CM

## 2013-03-19 NOTE — Patient Instructions (Signed)
Your physician wants you to follow-up in: 6 months You will receive a reminder letter in the mail two months in advance. If you don't receive a letter, please call our office to schedule the follow-up appointment.  Your physician recommends that you return for a FASTING lipid profile: 6 months   Your physician recommends that you continue on your current medications as directed. Please refer to the Current Medication list given to you today.  

## 2013-03-19 NOTE — Assessment & Plan Note (Signed)
He tried to decrease his crestor to 10 but his LDL increased to 103.  He has now increase his crestor to 20. Will check lipids again in 6 months at his next ov.

## 2013-03-19 NOTE — Assessment & Plan Note (Signed)
Doing well,  No CP  Continue current meds

## 2013-03-19 NOTE — Progress Notes (Signed)
[name] Date of Birth  December 06, 1940 [facility] [address]    [address] [place], [place]  [address] [phone]  Fax  [phone]  Problem List 1. CAD- s/p stenting 2009 Deborah Chalk)  2. Hyperlipidemia   History of Present Illness:  Ferne Reus is a 72 year old gentleman with a history of coronary artery disease. Status post inferior wall myocardial infarction in 2009. He had a stent placed at that time. He is also status post cutting balloon procedure in 2009.  Has a history of hyperlipidemia.  He's done very well. He works out at [employer] on a regular basis. He's not had any episodes of chest pain or shortness of breath.  He still works out at [employer].   Sep 18, 2012:  Still working out at [employer].   He is having some memory problems and wants to decrease the dose of crestor.   Nov. 17, 2014:  Doing well, having some sinus problems.  No CP.   Still going to [facility] 5 days a week.   His most recent lipid profile had an LDL of 103 - he had tried to decrease his crestor to 10.  He is back on 20 mg a day.  Current Outpatient Prescriptions on File Prior to Visit  Medication Sig Dispense Refill  . aspirin 81 MG tablet Take 81 mg by mouth daily.      . famotidine (PEPCID) 40 MG tablet TAKE 1 TABLET EACH NIGHT  30 tablet  11  . GABAPENTIN PO Take 300 mg by mouth 2 (two) times daily.       . metoprolol (LOPRESSOR) 50 MG tablet TAKE 1/2 TABLET BY MOUTH 2 TIMES DAILY  60 tablet  5  . nitroGLYCERIN (NITROSTAT) 0.4 MG SL tablet Place 1 tablet (0.4 mg total) under the tongue every 5 (five) minutes as needed for chest pain.  25 tablet  3  . rosuvastatin (CRESTOR) 20 MG tablet Take 1 tablet (20 mg total) by mouth daily.  30 tablet  5   No current facility-administered medications on file prior to visit.    No Known Allergies  Past Medical History  Diagnosis Date  . Coronary artery disease     Stent to prox RCA 2009 with cutting balloon to PDA  . Acute inferior myocardial  infarction      Stent to right coronary on Sep 06, 2007 with 3.5 x 20 mm Liberte non-drug eluting stent  . Hyperlipidemia   . History of tobacco abuse     Past Surgical History  Procedure Laterality Date  . Back surgery   February 2009    History  Smoking status  . Former Smoker  Smokeless tobacco  . Not on file    History  Alcohol Use No    Family History  Problem Relation Age of Onset  . Heart attack Father 60    Reviw of Systems:  Reviewed in the HPI.  All other systems are negative.  Physical Exam: BP 120/78  Pulse 68  Ht 5\' 8"  (1.727 m)  Wt 148 lb (67.132 kg)  BMI 22.51 kg/m2 The patient is alert and oriented x 3.  The mood and affect are normal.   Skin: warm and dry.  Color is normal.    HEENT:   [place] / AT. Normal carotids, no JVD  Lungs: Clear to auscultation   Heart: Regular S1-S2.    Abdomen: His abdomen is nontender. His good bowel sounds. Is no hepatosplenomegaly.  Extremities:  No clubbing cyanosis or edema.  Neuro:  The exam is nonfocal. His gait is normal.    EKG: 03/19/13: NSR at 68.  Rare PVCs. Otherwise normal.   Assessment / Plan:

## 2013-05-06 ENCOUNTER — Other Ambulatory Visit: Payer: Self-pay | Admitting: Cardiovascular Disease

## 2013-09-04 ENCOUNTER — Other Ambulatory Visit: Payer: Self-pay | Admitting: Cardiovascular Disease

## 2013-09-13 ENCOUNTER — Ambulatory Visit (INDEPENDENT_AMBULATORY_CARE_PROVIDER_SITE_OTHER): Payer: Medicare Other | Admitting: *Deleted

## 2013-09-13 DIAGNOSIS — E785 Hyperlipidemia, unspecified: Secondary | ICD-10-CM

## 2013-09-13 DIAGNOSIS — I2510 Atherosclerotic heart disease of native coronary artery without angina pectoris: Secondary | ICD-10-CM

## 2013-09-13 LAB — LIPID PANEL
CHOLESTEROL: 152 mg/dL (ref 0–200)
HDL: 62.9 mg/dL (ref >39.00)
LDL Cholesterol: 77 mg/dL (ref 0–99)
TRIGLYCERIDES: 60 mg/dL (ref 0.0–149.0)
Total CHOL/HDL Ratio: 2
VLDL: 12 mg/dL (ref 0.0–40.0)

## 2013-09-13 LAB — HEPATIC FUNCTION PANEL
ALBUMIN: 4.2 g/dL (ref 3.5–5.2)
ALT: 21 U/L (ref 0–53)
AST: 31 U/L (ref 0–37)
Alkaline Phosphatase: 34 U/L — ABNORMAL LOW (ref 39–117)
Bilirubin, Direct: 0 mg/dL (ref 0.0–0.3)
TOTAL PROTEIN: 6.8 g/dL (ref 6.0–8.3)
Total Bilirubin: 0.9 mg/dL (ref 0.2–1.2)

## 2013-09-13 LAB — BASIC METABOLIC PANEL
BUN: 21 mg/dL (ref 6–23)
CO2: 27 mEq/L (ref 19–32)
Calcium: 9.5 mg/dL (ref 8.4–10.5)
Chloride: 105 mEq/L (ref 96–112)
Creatinine, Ser: 1.1 mg/dL (ref 0.4–1.5)
GFR: 70.05 mL/min (ref >60.00)
Glucose, Bld: 86 mg/dL (ref 70–99)
POTASSIUM: 3.8 meq/L (ref 3.5–5.1)
SODIUM: 138 meq/L (ref 135–145)

## 2013-09-13 NOTE — Progress Notes (Signed)
Quick Note:  Preliminary report reviewed by triage nurse and sent to MD desk. ______ 

## 2013-09-17 ENCOUNTER — Ambulatory Visit: Payer: Medicare Other | Admitting: Cardiovascular Disease

## 2013-10-05 ENCOUNTER — Encounter: Payer: Self-pay | Admitting: Cardiovascular Disease

## 2013-10-05 ENCOUNTER — Ambulatory Visit (INDEPENDENT_AMBULATORY_CARE_PROVIDER_SITE_OTHER): Payer: Medicare Other | Admitting: Cardiovascular Disease

## 2013-10-05 ENCOUNTER — Encounter (INDEPENDENT_AMBULATORY_CARE_PROVIDER_SITE_OTHER): Payer: Self-pay

## 2013-10-05 VITALS — BP 110/70 | HR 62 | Ht 68.0 in | Wt 148.4 lb

## 2013-10-05 DIAGNOSIS — I2510 Atherosclerotic heart disease of native coronary artery without angina pectoris: Secondary | ICD-10-CM

## 2013-10-05 DIAGNOSIS — E785 Hyperlipidemia, unspecified: Secondary | ICD-10-CM

## 2013-10-05 NOTE — Assessment & Plan Note (Addendum)
Stable.   Labs from last week look ok

## 2013-10-05 NOTE — Progress Notes (Signed)
Alexander [name] Date of Birth  22-Nov-1940 [name] [address]    [name], [place]  [address] [phone]  Fax  [phone]  Problem List 1. CAD- s/p stenting 2009 [place] [name])  2. Hyperlipidemia   History of Present Illness:  Alexander [name] is a 73 year old gentleman with a history of coronary artery disease. Status post inferior wall myocardial infarction in 2009. He had a stent placed at that time. He is also status post cutting balloon procedure in 2009.  Has a history of hyperlipidemia.  He's done very well. He works out at [employer] on a regular basis. He's not had any episodes of chest pain or shortness of breath.  He still works out at [employer].   Sep 18, 2012:  Still working out at [employer].   He is having some memory problems and wants to decrease the dose of crestor.   Nov. 17, 2014:  Doing well, having some sinus problems.  No CP.   Still going to [facility] 5 days a week.   His most recent lipid profile had an LDL of 103 - he had tried to decrease his crestor to 10.  He is back on 20 mg a day.  October 05, 2013: Alexander [name] is a doing very well. He's a former patient of Dr. Doreatha [name].   He goes to the [facility]   Current Outpatient Prescriptions on File Prior to Visit  Medication Sig Dispense Refill  . aspirin 81 MG tablet Take 81 mg by mouth daily.      . CRESTOR 20 MG tablet TAKE 1 TABLET BY MOUTH EVERY DAY  30 tablet  3  . famotidine (PEPCID) 40 MG tablet TAKE 1 TABLET EACH NIGHT  30 tablet  11  . GABAPENTIN PO Take 300 mg by mouth 2 (two) times daily.       . metoprolol (LOPRESSOR) 50 MG tablet TAKE 1/2 TABLET BY MOUTH 2 TIMES DAILY  60 tablet  5  . nitroGLYCERIN (NITROSTAT) 0.4 MG SL tablet Place 1 tablet (0.4 mg total) under the tongue every 5 (five) minutes as needed for chest pain.  25 tablet  3   No current facility-administered medications on file prior to visit.    No Known Allergies  Past Medical History  Diagnosis Date  . Coronary artery disease    Stent to prox RCA 2009 with cutting balloon to PDA  . Acute inferior myocardial infarction      Stent to right coronary on Sep 06, 2007 with 3.5 x 20 mm Liberte non-drug eluting stent  . Hyperlipidemia   . History of tobacco abuse     Past Surgical History  Procedure Laterality Date  . Back surgery   February 2009    History  Smoking status  . Former Smoker  Smokeless tobacco  . Not on file    History  Alcohol Use No    Family History  Problem Relation Age of Onset  . Heart attack Father 24    Reviw of Systems:  Reviewed in the HPI.  All other systems are negative.  Physical Exam: BP 110/70  Pulse 62  Ht 5\' 8"  (1.727 m)  Wt 148 lb 6.4 oz (67.314 kg)  BMI 22.57 kg/m2 The patient is alert and oriented x 3.  The mood and affect are normal.   Skin: warm and dry.  Color is normal.    HEENT:   [place] / AT. Normal carotids, no JVD  Lungs: Clear to auscultation  Heart: Regular S1-S2.    Abdomen: His abdomen is nontender. His good bowel sounds. Is no hepatosplenomegaly.  Extremities:  No clubbing cyanosis or edema.  Neuro:  The exam is nonfocal. His gait is normal.    EKG: 03/19/13: NSR at 68.  Rare PVCs. Otherwise normal.   Assessment / Plan:

## 2013-10-05 NOTE — Patient Instructions (Signed)
Your physician recommends that you continue on your current medications as directed. Please refer to the Current Medication list given to you today.  Your physician wants you to follow-up in: 1 year with Dr. Acie Fredrickson.  You will receive a reminder letter in the mail two months in advance. If you don't receive a letter, please call our office to schedule the follow-up appointment.  Your physician recommends that you return for lab work in: 12 months on the day of or a few days before your office visit with Dr. Acie Fredrickson.  You will need to FAST for this appointment - nothing to eat or drink after midnight the night before except water.

## 2013-10-05 NOTE — Assessment & Plan Note (Signed)
Alexander [name] is doing well. Continue with the same medications.

## 2013-12-10 ENCOUNTER — Other Ambulatory Visit: Payer: Self-pay | Admitting: Cardiovascular Disease

## 2013-12-21 ENCOUNTER — Other Ambulatory Visit: Payer: Self-pay | Admitting: Cardiovascular Disease

## 2014-04-09 ENCOUNTER — Telehealth: Payer: Self-pay | Admitting: Cardiovascular Disease

## 2014-04-09 NOTE — Telephone Encounter (Signed)
Will see him and evaluate him at that time

## 2014-04-09 NOTE — Telephone Encounter (Signed)
New message  Pt call called states that his right foot and portions of his left foot feels as if it is falling asleep. On his right foot he cannot feel his toes.. Pt reports he cannot use them (his toes)  when he is walking. please call back to discuss//sr

## 2014-04-09 NOTE — Telephone Encounter (Signed)
Spoke with patient who states he has been having bilateral foot numbness (different portions of the foot, see below) for 1-2 months.  Patient states he talked to a retired cardiologist at the [facility] who advised that patient may have restricted blood flow from spinal stenosis and that he should call to schedule an appointment with his cardiologist.  Patient states he was seen by his neurosurgeon as he has a history of back pain/surgery and MRI did not reveal a cause for numbness.  Patient states at times is it difficult to pick up his feet to step - it is like he doesn't have good control.  I scheduled patient to see Dr. Acie Fredrickson on 1/7 and advised patient to call back with worsening symptoms or concerns.  Patient verbalized understanding and agreement.

## 2014-04-21 ENCOUNTER — Other Ambulatory Visit: Payer: Self-pay | Admitting: Cardiovascular Disease

## 2014-05-09 ENCOUNTER — Ambulatory Visit (INDEPENDENT_AMBULATORY_CARE_PROVIDER_SITE_OTHER): Payer: Medicare Other | Admitting: Cardiovascular Disease

## 2014-05-09 ENCOUNTER — Encounter: Payer: Self-pay | Admitting: Cardiovascular Disease

## 2014-05-09 VITALS — BP 116/74 | HR 67 | Ht 68.0 in | Wt 152.2 lb

## 2014-05-09 DIAGNOSIS — I2510 Atherosclerotic heart disease of native coronary artery without angina pectoris: Secondary | ICD-10-CM

## 2014-05-09 DIAGNOSIS — E785 Hyperlipidemia, unspecified: Secondary | ICD-10-CM

## 2014-05-09 DIAGNOSIS — I739 Peripheral vascular disease, unspecified: Secondary | ICD-10-CM

## 2014-05-09 MED ORDER — ROSUVASTATIN CALCIUM 10 MG PO TABS: 10.0000 mg | ORAL_TABLET | Freq: Every day | ORAL | Status: DC

## 2014-05-09 NOTE — Assessment & Plan Note (Signed)
He has poor distal foot pulses.  He has pains similar to neuropathy. Will have him get ABIs.

## 2014-05-09 NOTE — Progress Notes (Signed)
Alexander [name] Date of Birth  03-Jan-1941  [facility] [address]    [name], [place]  [address] [phone]  Fax  [phone]  Problem List 1. CAD- s/p stenting 2009 [place] [name])  2. Hyperlipidemia   History of Present Illness:  Alexander [name] is a 74 year old gentleman with a history of coronary artery disease. Status post inferior wall myocardial infarction in 2009. He had a stent placed at that time. He is also status post cutting balloon procedure in 2009.  Has a history of hyperlipidemia.  He's done very well. He works out at [employer] on a regular basis. He's not had any episodes of chest pain or shortness of breath.  He still works out at [employer].   Sep 18, 2012:  Still working out at [employer].   He is having some memory problems and wants to decrease the dose of crestor.   Nov. 17, 2014:  Doing well, having some sinus problems.  No CP.   Still going to [facility] 5 days a week.   His most recent lipid profile had an LDL of 103 - he had tried to decrease his crestor to 10.  He is back on 20 mg a day.  October 05, 2013: Alexander [name] is a doing very well. He's a former patient of Dr. Doreatha [name].   He goes to the [employer]   Jan. 7, 2016: Alexander [name] is a 74 yo with hx of CAD and hyperlipidemia. Alexander [name] is not doing as well today.  Is having some back pain.  He recently saw Dr. Doreatha [name] at the [facility] .  He having some control issues with his right leg.      Current Outpatient Prescriptions on File Prior to Visit  Medication Sig Dispense Refill  . aspirin 81 MG tablet Take 81 mg by mouth daily.    . CRESTOR 20 MG tablet TAKE 1 TABLET BY MOUTH EVERY DAY 30 tablet 11  . famotidine (PEPCID) 40 MG tablet TAKE 1 TABLET EACH NIGHT 30 tablet 11  . GABAPENTIN PO Take 300 mg by mouth 3 (three) times daily.     . metoprolol (LOPRESSOR) 50 MG tablet TAKE 1/2 TABLET BY MOUTH 2 TIMES DAILY 30 tablet 5  . nitroGLYCERIN (NITROSTAT) 0.4 MG SL tablet Place 1 tablet (0.4 mg total) under the tongue every 5  (five) minutes as needed for chest pain. 25 tablet 3   No current facility-administered medications on file prior to visit.    No Known Allergies  Past Medical History  Diagnosis Date  . Coronary artery disease     Stent to prox RCA 2009 with cutting balloon to PDA  . Acute inferior myocardial infarction      Stent to right coronary on Sep 06, 2007 with 3.5 x 20 mm Liberte non-drug eluting stent  . Hyperlipidemia   . History of tobacco abuse     Past Surgical History  Procedure Laterality Date  . Back surgery   February 2009    History  Smoking status  . Former Smoker  Smokeless tobacco  . Not on file    History  Alcohol Use No    Family History  Problem Relation Age of Onset  . Heart attack Father 70    Reviw of Systems:  Reviewed in the HPI.  All other systems are negative.  Physical Exam: BP 116/74 mmHg  Pulse 67  Ht 5\' 8"  (1.727 m)  Wt 152 lb 3.2 oz (69.037 kg)  BMI 23.15 kg/m2 The  patient is alert and oriented x 3.  The mood and affect are normal.   Skin: warm and dry.  Color is normal.    HEENT:   [place] / AT. Normal carotids, no JVD  Lungs: Clear to auscultation   Heart: Regular S1-S2.    Abdomen: His abdomen is nontender. His good bowel sounds. Is no hepatosplenomegaly.  Extremities:  No clubbing cyanosis or edema.  Neuro:  The exam is nonfocal. His gait is normal.    EKG: Jan. 7, 2015:  NSR at 67.  No ST or T wave changes.    Assessment / Plan:

## 2014-05-09 NOTE — Patient Instructions (Signed)
Your physician has recommended you make the following change in your medication:  1) DECREASE your Crestor to 10mg  daily  Your physician recommends that you return for lab work in: 3 months (BMET, Lipid Panel, Hepatic Panel)  Your physician has requested that you have an ankle brachial index (ABI). During this test an ultrasound and blood pressure cuff are used to evaluate the arteries that supply the arms and legs with blood. Allow thirty minutes for this exam. There are no restrictions or special instructions.  Your physician wants you to follow-up in: 6 months with Dr. Acie Fredrickson. You will receive a reminder letter in the mail two months in advance. If you don't receive a letter, please call our office to schedule the follow-up appointment.

## 2014-05-09 NOTE — Assessment & Plan Note (Signed)
No angina. Works out at [employer] regularly.

## 2014-05-09 NOTE — Assessment & Plan Note (Signed)
He's having problems with memory. Wants to decrease crestor dose Will reduce to 10 mg a day. reckeck labs in 6 months. 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

## 2014-05-10 ENCOUNTER — Other Ambulatory Visit (HOSPITAL_COMMUNITY): Payer: Self-pay | Admitting: *Deleted

## 2014-05-10 ENCOUNTER — Ambulatory Visit (HOSPITAL_COMMUNITY): Payer: Medicare Other | Attending: Cardiovascular Disease | Admitting: Cardiology

## 2014-05-10 DIAGNOSIS — I10 Essential (primary) hypertension: Secondary | ICD-10-CM | POA: Insufficient documentation

## 2014-05-10 DIAGNOSIS — Z87891 Personal history of nicotine dependence: Secondary | ICD-10-CM | POA: Diagnosis not present

## 2014-05-10 DIAGNOSIS — I2510 Atherosclerotic heart disease of native coronary artery without angina pectoris: Secondary | ICD-10-CM | POA: Diagnosis not present

## 2014-05-10 DIAGNOSIS — R209 Unspecified disturbances of skin sensation: Secondary | ICD-10-CM | POA: Diagnosis not present

## 2014-05-10 DIAGNOSIS — I70209 Unspecified atherosclerosis of native arteries of extremities, unspecified extremity: Secondary | ICD-10-CM

## 2014-05-10 DIAGNOSIS — R200 Anesthesia of skin: Secondary | ICD-10-CM | POA: Insufficient documentation

## 2014-05-10 DIAGNOSIS — I739 Peripheral vascular disease, unspecified: Secondary | ICD-10-CM

## 2014-05-10 DIAGNOSIS — E785 Hyperlipidemia, unspecified: Secondary | ICD-10-CM | POA: Diagnosis not present

## 2014-05-10 NOTE — Progress Notes (Signed)
LEA Doppler + Duplex performed

## 2014-05-13 ENCOUNTER — Telehealth: Payer: Self-pay | Admitting: Cardiovascular Disease

## 2014-05-13 NOTE — Telephone Encounter (Signed)
New Msg          Pt calling about results of last office visit, Please contact at [phone] if pt must be reached.

## 2014-05-13 NOTE — Telephone Encounter (Signed)
Spoke with patient and advised that Dr. Acie Fredrickson is still reviewing his tests and is discussing with one of his colleagues.  I advised patient I will call him with Dr. Elmarie Shiley advice.  Patient verbalized understanding and gratitude.

## 2014-05-15 NOTE — Telephone Encounter (Signed)
Spoke with patient and advised him of test results and Dr. Elmarie Shiley advice for [facility] Consult with Dr. Irish Lack.  Patient is in agreement with plan and scheduled for 2/10 appointment.  Patient advised he is available if a sooner appointment becomes available.

## 2014-05-16 ENCOUNTER — Telehealth: Payer: Self-pay | Admitting: Cardiovascular Disease

## 2014-05-16 NOTE — Telephone Encounter (Signed)
Follow up:    Pt would like a call back about his  test results from doppler please, call his cell.

## 2014-05-17 NOTE — Telephone Encounter (Signed)
Spoke with patient who states he received a MyChart message stating he had new test results.  Patient aware that I reviewed results with him on 05/15/14 and has no further questions.  Patient expressed gratitude for the call and will call back with questions or concerns.

## 2014-06-02 ENCOUNTER — Other Ambulatory Visit: Payer: Self-pay | Admitting: Cardiovascular Disease

## 2014-06-12 ENCOUNTER — Institutional Professional Consult (permissible substitution): Payer: Medicare Other | Admitting: Interventional Cardiology

## 2014-06-18 ENCOUNTER — Encounter: Payer: Self-pay | Admitting: Cardiovascular Disease

## 2014-06-18 ENCOUNTER — Ambulatory Visit (INDEPENDENT_AMBULATORY_CARE_PROVIDER_SITE_OTHER): Payer: Medicare Other | Admitting: Cardiovascular Disease

## 2014-06-18 VITALS — BP 140/70 | HR 77 | Ht 68.0 in | Wt 150.0 lb

## 2014-06-18 DIAGNOSIS — I739 Peripheral vascular disease, unspecified: Secondary | ICD-10-CM

## 2014-06-18 DIAGNOSIS — I2510 Atherosclerotic heart disease of native coronary artery without angina pectoris: Secondary | ICD-10-CM

## 2014-06-18 NOTE — Patient Instructions (Addendum)
Your physician recommends that you continue on your current medications as directed. Please refer to the Current Medication list given to you today.  Your physician has recommended that you have an Aorto-Iliac Duplex.  Your physician recommends that you schedule a follow-up appointment in: 3 months with Dr. Fletcher Anon.

## 2014-06-20 ENCOUNTER — Encounter: Payer: Self-pay | Admitting: Cardiovascular Disease

## 2014-06-20 NOTE — Assessment & Plan Note (Signed)
He reports no anginal symptoms.  

## 2014-06-20 NOTE — Assessment & Plan Note (Signed)
The patient clearly has peripheral arterial disease likely involving the left iliac system. However, it is difficult to correlate this with his symptoms. He reports that he has equal bilateral foot numbness which seems to be neuropathic. The left iliac disease can certainly be causing some of his back pain but that's difficult to determine given the severity of his spinal stenosis.  Also the stenosis is unilateral and shouldn't be causing that much discomfort. Revascularization of the left iliac system might only give him partial relief of his symptoms. I requested an aortoiliac duplex to evaluate that. I will have him follow-up in few months for rediscussion.

## 2014-06-20 NOTE — Progress Notes (Signed)
Primary care physician: Dr. Rex Kras Primary cardiologist: Dr. Acie Fredrickson  HPI  This is a pleasant 74 year old man who was referred for evaluation and management of peripheral arterial disease. He has known history of coronary artery disease status post inferior ST elevation myocardial infarction in 2009 status post stent placement, hyperlipidemia and extensive back problems. He reports 2 back surgeries and multiple spinal injections with temporary improvement in symptoms. Due to bilateral foot numbness, who was referred for lower extremity arterial Doppler which showed a normal ABI on the right side and mildly decreased on the left at 0.7 with evidence of inflow disease. However, the patient reports that symptoms are equal in both legs and mainly manifested by bilateral foot numbness. He does have chronic back pain which happens at variable distances of walking and gets worse with standing up.  No Known Allergies   Current Outpatient Prescriptions on File Prior to Visit  Medication Sig Dispense Refill  . aspirin 81 MG tablet Take 81 mg by mouth daily.    . famotidine (PEPCID) 40 MG tablet TAKE 1 TABLET EACH NIGHT 30 tablet 11  . GABAPENTIN PO Take 300 mg by mouth 3 (three) times daily.     . metoprolol (LOPRESSOR) 50 MG tablet TAKE 1/2 TABLET BY MOUTH 2 TIMES DAILY 30 tablet 5  . naproxen (NAPROSYN) 500 MG tablet Take 500 mg by mouth 2 (two) times daily.  2  . rosuvastatin (CRESTOR) 10 MG tablet Take 1 tablet (10 mg total) by mouth daily. 90 tablet 3  . nitroGLYCERIN (NITROSTAT) 0.4 MG SL tablet Place 1 tablet (0.4 mg total) under the tongue every 5 (five) minutes as needed for chest pain. (Patient not taking: Reported on 06/18/2014) 25 tablet 3   No current facility-administered medications on file prior to visit.     Past Medical History  Diagnosis Date  . Coronary artery disease     Stent to prox RCA 2009 with cutting balloon to PDA  . Acute inferior myocardial infarction      Stent to  right coronary on Sep 06, 2007 with 3.5 x 20 mm Liberte non-drug eluting stent  . Hyperlipidemia   . History of tobacco abuse      Past Surgical History  Procedure Laterality Date  . Back surgery   February 2009     Family History  Problem Relation Age of Onset  . Heart attack Father 31     History   Social History  . Marital Status: Married    Spouse Name: N/A  . Number of Children: N/A  . Years of Education: N/A   Occupational History  . Not on file.   Social History Main Topics  . Smoking status: Former Research scientist (life sciences)  . Smokeless tobacco: Not on file  . Alcohol Use: No  . Drug Use: Not on file  . Sexual Activity: Not on file   Other Topics Concern  . Not on file   Social History Narrative     ROS A 10 point review of system was performed. It is negative other than that mentioned in the history of present illness.   PHYSICAL EXAM   BP 140/70 mmHg  Pulse 77  Ht 5\' 8"  (1.727 m)  Wt 150 lb (68.04 kg)  BMI 22.81 kg/m2 Constitutional: He is oriented to person, place, and time. He appears well-developed and well-nourished. No distress.  HENT: No nasal discharge.  Head: Normocephalic and atraumatic.  Eyes: Pupils are equal and round.  No discharge. Neck: Normal range of  motion. Neck supple. No JVD present. No thyromegaly present.  Cardiovascular: Normal rate, regular rhythm, normal heart sounds. Exam reveals no gallop and no friction rub. No murmur heard.  Pulmonary/Chest: Effort normal and breath sounds normal. No stridor. No respiratory distress. He has no wheezes. He has no rales. He exhibits no tenderness.  Abdominal: Soft. Bowel sounds are normal. He exhibits no distension. There is no tenderness. There is no rebound and no guarding.  Musculoskeletal: Normal range of motion. He exhibits no edema and no tenderness.  Neurological: He is alert and oriented to person, place, and time. Coordination normal.  Skin: Skin is warm and dry. No rash noted. He is not  diaphoretic. No erythema. No pallor.  Psychiatric: He has a normal mood and affect. His behavior is normal. Judgment and thought content normal.  Vascular: Radial pulses normal bilaterally. Femoral pulse: +2 on the right side with a bruit. Barely palpable on the left side. Distal pulses are palpable on the right side but not the left side.       ASSESSMENT AND PLAN

## 2014-06-24 ENCOUNTER — Ambulatory Visit (HOSPITAL_COMMUNITY): Payer: Medicare Other | Attending: Cardiovascular Disease | Admitting: Cardiology

## 2014-06-24 DIAGNOSIS — I745 Embolism and thrombosis of iliac artery: Secondary | ICD-10-CM | POA: Diagnosis not present

## 2014-06-24 DIAGNOSIS — I700 Atherosclerosis of aorta: Secondary | ICD-10-CM | POA: Insufficient documentation

## 2014-06-24 DIAGNOSIS — I739 Peripheral vascular disease, unspecified: Secondary | ICD-10-CM

## 2014-06-24 NOTE — Progress Notes (Signed)
Aorto-iliac duplex performed 

## 2014-07-09 ENCOUNTER — Telehealth: Payer: Self-pay | Admitting: Cardiovascular Disease

## 2014-07-09 NOTE — Telephone Encounter (Signed)
New message ° ° ° ° °Want test results °

## 2014-07-09 NOTE — Telephone Encounter (Signed)
I spoke with the pt's wife and made her aware of pt's aorto-iliac duplex results. She will have the pt contact our office with any questions.

## 2014-07-10 ENCOUNTER — Encounter: Payer: Self-pay | Admitting: Cardiovascular Disease

## 2014-08-08 ENCOUNTER — Other Ambulatory Visit (INDEPENDENT_AMBULATORY_CARE_PROVIDER_SITE_OTHER): Payer: Medicare Other | Admitting: *Deleted

## 2014-08-08 ENCOUNTER — Telehealth: Payer: Self-pay

## 2014-08-08 DIAGNOSIS — I2510 Atherosclerotic heart disease of native coronary artery without angina pectoris: Secondary | ICD-10-CM

## 2014-08-08 LAB — HEPATIC FUNCTION PANEL
ALT: 21 U/L (ref 0–53)
AST: 25 U/L (ref 0–37)
Albumin: 3.9 g/dL (ref 3.5–5.2)
Alkaline Phosphatase: 34 U/L — ABNORMAL LOW (ref 39–117)
Bilirubin, Direct: 0.1 mg/dL (ref 0.0–0.3)
TOTAL PROTEIN: 6.5 g/dL (ref 6.0–8.3)
Total Bilirubin: 0.4 mg/dL (ref 0.2–1.2)

## 2014-08-08 LAB — LIPID PANEL
Cholesterol: 159 mg/dL (ref 0–200)
HDL: 61.7 mg/dL (ref >39.00)
LDL CALC: 82 mg/dL (ref 0–99)
NonHDL: 97.3
Total CHOL/HDL Ratio: 3
Triglycerides: 76 mg/dL (ref 0.0–149.0)
VLDL: 15.2 mg/dL (ref 0.0–40.0)

## 2014-08-08 LAB — BASIC METABOLIC PANEL
BUN: 21 mg/dL (ref 6–23)
CHLORIDE: 106 meq/L (ref 96–112)
CO2: 28 meq/L (ref 19–32)
Calcium: 9.7 mg/dL (ref 8.4–10.5)
Creatinine, Ser: 1.09 mg/dL (ref 0.40–1.50)
GFR: 70.61 mL/min (ref >60.00)
Glucose, Bld: 78 mg/dL (ref 70–99)
Potassium: 4 mEq/L (ref 3.5–5.1)
Sodium: 140 mEq/L (ref 135–145)

## 2014-08-08 NOTE — Telephone Encounter (Signed)
SPOKE WITH PATIENT ABOUT LAB RESULTS. PATIENT VERBALIZED UNDERSTANDING AND WILL CONTINUE WITH CURRENT TREATMENT PLAN.

## 2014-09-17 ENCOUNTER — Encounter: Payer: Self-pay | Admitting: Cardiovascular Disease

## 2014-09-17 ENCOUNTER — Ambulatory Visit: Payer: Medicare Other | Admitting: Cardiovascular Disease

## 2014-09-17 ENCOUNTER — Ambulatory Visit (INDEPENDENT_AMBULATORY_CARE_PROVIDER_SITE_OTHER): Payer: Medicare Other | Admitting: Cardiovascular Disease

## 2014-09-17 VITALS — BP 132/64 | HR 70 | Ht 68.0 in | Wt 152.0 lb

## 2014-09-17 DIAGNOSIS — E785 Hyperlipidemia, unspecified: Secondary | ICD-10-CM | POA: Diagnosis not present

## 2014-09-17 DIAGNOSIS — I739 Peripheral vascular disease, unspecified: Secondary | ICD-10-CM | POA: Diagnosis not present

## 2014-09-17 DIAGNOSIS — I2510 Atherosclerotic heart disease of native coronary artery without angina pectoris: Secondary | ICD-10-CM | POA: Diagnosis not present

## 2014-09-17 NOTE — Assessment & Plan Note (Signed)
Continue treatment with rosuvastatin with a target LDL of less than 70. 

## 2014-09-17 NOTE — Assessment & Plan Note (Signed)
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 Thus, I doubt that revascularization would make significant improvement in his symptoms and functional capacity. Thus, I recommend continuing medical therapy and follow-up with me on a yearly basis.

## 2014-09-17 NOTE — Progress Notes (Signed)
Primary care physician: Dr. Rex Kras Primary cardiologist: Dr. Acie Fredrickson  HPI  This is a pleasant 74 year old man who was is here today for a follow-up visit regarding  peripheral arterial disease. He has known history of coronary artery disease status post inferior ST elevation myocardial infarction in 2009 status post stent placement, hyperlipidemia and extensive back problems. He reports 2 back surgeries and multiple spinal injections with temporary improvement in symptoms. Due to bilateral foot numbness, who was referred for lower extremity arterial Doppler which showed a normal ABI on the right side and mildly decreased on the left at 0.7 with evidence of inflow disease. However, the patient reports that symptoms are equal in both legs and mainly manifested by bilateral foot numbness. He does have chronic back pain which happens at variable distances of walking and gets worse with standing up. He actually reports that the tingling sensation is worse on the right side than the left side. Aortoiliac duplex showed an occluded left common iliac artery and borderline significant stenosis involving the right common iliac artery.  No Known Allergies   Current Outpatient Prescriptions on File Prior to Visit  Medication Sig Dispense Refill  . aspirin 81 MG tablet Take 81 mg by mouth daily.    . famotidine (PEPCID) 40 MG tablet TAKE 1 TABLET EACH NIGHT 30 tablet 11  . GABAPENTIN PO Take 300 mg by mouth 3 (three) times daily.     . metoprolol (LOPRESSOR) 50 MG tablet TAKE 1/2 TABLET BY MOUTH 2 TIMES DAILY 30 tablet 5  . naproxen (NAPROSYN) 500 MG tablet Take 500 mg by mouth 2 (two) times daily.  2  . nitroGLYCERIN (NITROSTAT) 0.4 MG SL tablet Place 1 tablet (0.4 mg total) under the tongue every 5 (five) minutes as needed for chest pain. 25 tablet 3  . rosuvastatin (CRESTOR) 10 MG tablet Take 1 tablet (10 mg total) by mouth daily. 90 tablet 3   No current facility-administered medications on file prior  to visit.     Past Medical History  Diagnosis Date  . Coronary artery disease     Stent to prox RCA 2009 with cutting balloon to PDA  . Acute inferior myocardial infarction      Stent to right coronary on Sep 06, 2007 with 3.5 x 20 mm Liberte non-drug eluting stent  . Hyperlipidemia   . History of tobacco abuse      Past Surgical History  Procedure Laterality Date  . Back surgery   February 2009     Family History  Problem Relation Age of Onset  . Heart attack Father 10  . Cancer Mother   . Cancer Brother      History   Social History  . Marital Status: Married    Spouse Name: N/A  . Number of Children: N/A  . Years of Education: N/A   Occupational History  . Not on file.   Social History Main Topics  . Smoking status: Former Research scientist (life sciences)  . Smokeless tobacco: Not on file  . Alcohol Use: No  . Drug Use: Not on file  . Sexual Activity: Not on file   Other Topics Concern  . Not on file   Social History Narrative     ROS A 10 point review of system was performed. It is negative other than that mentioned in the history of present illness.   PHYSICAL EXAM   BP 132/64 mmHg  Pulse 70  Ht 5\' 8"  (1.727 m)  Wt 152 lb (68.947 kg)  BMI 23.12 kg/m2 Constitutional: He is oriented to person, place, and time. He appears well-developed and well-nourished. No distress.  HENT: No nasal discharge.  Head: Normocephalic and atraumatic.  Eyes: Pupils are equal and round.  No discharge. Neck: Normal range of motion. Neck supple. No JVD present. No thyromegaly present.  Cardiovascular: Normal rate, regular rhythm, normal heart sounds. Exam reveals no gallop and no friction rub. No murmur heard.  Pulmonary/Chest: Effort normal and breath sounds normal. No stridor. No respiratory distress. He has no wheezes. He has no rales. He exhibits no tenderness.  Abdominal: Soft. Bowel sounds are normal. He exhibits no distension. There is no tenderness. There is no rebound and no  guarding.  Musculoskeletal: Normal range of motion. He exhibits no edema and no tenderness.  Neurological: He is alert and oriented to person, place, and time. Coordination normal.  Skin: Skin is warm and dry. No rash noted. He is not diaphoretic. No erythema. No pallor.  Psychiatric: He has a normal mood and affect. His behavior is normal. Judgment and thought content normal.  Vascular: Radial pulses normal bilaterally. Femoral pulse: +2 on the right side with a bruit. Barely palpable on the left side. Distal pulses are palpable on the right side but not the left side.       ASSESSMENT AND PLAN

## 2014-09-17 NOTE — Assessment & Plan Note (Signed)
He denies symptoms of angina. He is on good medical therapy.

## 2014-09-17 NOTE — Patient Instructions (Signed)
Medication Instructions:  Your physician recommends that you continue on your current medications as directed. Please refer to the Current Medication list given to you today.  Labwork: No new orders.   Testing/Procedures: No new orders.   Follow-Up: Your physician wants you to follow-up in: 1 YEAR with Dr Arida.  You will receive a reminder letter in the mail two months in advance. If you don't receive a letter, please call our office to schedule the follow-up appointment.   Any Other Special Instructions Will Be Listed Below (If Applicable).   

## 2014-12-26 ENCOUNTER — Other Ambulatory Visit: Payer: Self-pay | Admitting: Cardiovascular Disease

## 2014-12-29 NOTE — Progress Notes (Addendum)
[name] Date of Birth  07-21-1940 [facility] [address]    [place], [place]  [address] [phone]  Fax  [phone]  Problem List 1. CAD- s/p stenting 2009 [name])  2. Hyperlipidemia 3. Peripheral Vascular  disease  4. Peripheral neuropathy     History of Present Illness:  Alexander [name] is a 74 year old gentleman with a history of coronary artery disease. Status post inferior wall myocardial infarction in 2009. He had a stent placed at that time. He is also status post cutting balloon procedure in 2009.  Has a history of hyperlipidemia.  He's done very well. He works out at [employer] on a regular basis. He's not had any episodes of chest pain or shortness of breath.  He still works out at [employer].   Sep 18, 2012:  Still working out at [employer].   He is having some memory problems and wants to decrease the dose of crestor.   Nov. 17, 2014:  Doing well, having some sinus problems.  No CP.   Still going to [facility] 5 days a week.   His most recent lipid profile had an LDL of 103 - he had tried to decrease his crestor to 10.  He is back on 20 mg a day.  October 05, 2013: Rayansh is a doing very well. He's a former patient of Dr. [name].   He goes to the [employer]   Jan. 7, 2016: Jarom is a 74 yo with hx of CAD and hyperlipidemia. Gryffin is not doing as well today.  Is having some back pain.  He recently saw Dr. [name] at the [facility] .  He having some control issues with his right leg.    December 30, 2014:  Has some shortness of breath with exertion.  Very short of breath when he climbs 2 flights of stairs Has had some leg pain , wakes him up at night. Has known PVD   Current Outpatient Prescriptions on File Prior to Visit  Medication Sig Dispense Refill  . aspirin 81 MG tablet Take 81 mg by mouth daily.    . famotidine (PEPCID) 40 MG tablet TAKE 1 TABLET EACH NIGHT 30 tablet 11  . GABAPENTIN PO Take 300 mg by mouth 3 (three) times daily.     . metoprolol  (LOPRESSOR) 50 MG tablet TAKE 1/2 TABLET BY MOUTH 2 TIMES DAILY 30 tablet 11  . nitroGLYCERIN (NITROSTAT) 0.4 MG SL tablet Place 1 tablet (0.4 mg total) under the tongue every 5 (five) minutes as needed for chest pain. 25 tablet 3  . rosuvastatin (CRESTOR) 10 MG tablet Take 1 tablet (10 mg total) by mouth daily. 90 tablet 3   No current facility-administered medications on file prior to visit.    No Known Allergies  Past Medical History  Diagnosis Date  . Coronary artery disease     Stent to prox RCA 2009 with cutting balloon to PDA  . Acute inferior myocardial infarction      Stent to right coronary on Sep 06, 2007 with 3.5 x 20 mm Liberte non-drug eluting stent  . Hyperlipidemia   . History of tobacco abuse     Past Surgical History  Procedure Laterality Date  . Back surgery   February 2009    History  Smoking status  . Former Smoker  Smokeless tobacco  . Not on file    History  Alcohol Use No    Family History  Problem Relation Age of Onset  .  Heart attack Father 56  . Cancer Mother   . Cancer Brother     Reviw of Systems:  Reviewed in the HPI.  All other systems are negative.  Physical Exam: BP 110/70 mmHg  Pulse 72  Ht 5\' 8"  (1.727 m)  Wt 65.227 kg (143 lb 12.8 oz)  BMI 21.87 kg/m2  SpO2 96% The patient is alert and oriented x 3.  The mood and affect are normal.   Skin: warm and dry.  Color is normal.    HEENT:   [place] / AT. Normal carotids, no JVD  Lungs: Clear to auscultation   Heart: Regular S1-S2.    Abdomen: His abdomen is nontender. His good bowel sounds. Is no hepatosplenomegaly.  Extremities:  No clubbing cyanosis or edema.  Poor distal pulses in legs   Neuro:  The exam is nonfocal. His gait is normal.    EKG: Aug. 29, 2016:  Sinus rhythm at 72 with occasional PVCs   Assessment / Plan:   1. CAD- s/p stenting 2009 ( [facility])  No angina,  Does have some DOE - especially with climbing 2 flights of stairs. Will get an echo  He has not  had an assessment of his LV function in years    2. Hyperlipidemia - last labs in April looked good.  Will check again in 6 months with his office visit   3. Peripheral vascular disease:  Has an occluded left iliac artery . Moderate disease in right . Follow up with Dr. Fletcher Anon   4. Peripheral neuropathy :  He should see his primary medical doctor   5. Dyspnea:  He complains of shortness of breath with ambulation .  His O2 sat was 96% at room air - was 94% after ambulating around the office .   Nahser, Wonda Cheng, MD  12/30/2014 1:51 PM    [address] [place],  [place], [place]  [address] Pager [phone] Phone: [phone]; Fax: [phone]   [facility]  [address] [place], [place]  [address] [phone]   Fax [phone]

## 2014-12-30 ENCOUNTER — Ambulatory Visit: Payer: Medicare Other | Admitting: Cardiovascular Disease

## 2014-12-30 ENCOUNTER — Encounter: Payer: Self-pay | Admitting: Cardiovascular Disease

## 2014-12-30 ENCOUNTER — Ambulatory Visit (INDEPENDENT_AMBULATORY_CARE_PROVIDER_SITE_OTHER): Payer: Medicare Other | Admitting: Cardiovascular Disease

## 2014-12-30 VITALS — BP 110/70 | HR 72 | Ht 68.0 in | Wt 143.8 lb

## 2014-12-30 DIAGNOSIS — E785 Hyperlipidemia, unspecified: Secondary | ICD-10-CM | POA: Diagnosis not present

## 2014-12-30 DIAGNOSIS — I2510 Atherosclerotic heart disease of native coronary artery without angina pectoris: Secondary | ICD-10-CM | POA: Diagnosis not present

## 2014-12-30 DIAGNOSIS — R0600 Dyspnea, unspecified: Secondary | ICD-10-CM | POA: Diagnosis not present

## 2014-12-30 DIAGNOSIS — I739 Peripheral vascular disease, unspecified: Secondary | ICD-10-CM

## 2014-12-30 NOTE — Addendum Note (Signed)
Addended by: Alvina Filbert B on: 12/30/2014 08:47 AM   Modules accepted: Orders

## 2014-12-30 NOTE — Patient Instructions (Signed)
Medication Instructions:  Your physician recommends that you continue on your current medications as directed. Please refer to the Current Medication list given to you today.  Labwork: none  Testing/Procedures: Your physician has requested that you have an echocardiogram. Echocardiography is a painless test that uses sound waves to create images of your heart. It provides your doctor with information about the size and shape of your heart and how well your heart's chambers and valves are working. This procedure takes approximately one hour. There are no restrictions for this procedure.  Follow-Up: Your physician wants you to follow-up in: 6 months with fasting labs (lp/bmet/hfp)  You will receive a reminder letter in the mail two months in advance. If you don't receive a letter, please call our office to schedule the follow-up appointment.

## 2015-01-09 ENCOUNTER — Other Ambulatory Visit: Payer: Self-pay

## 2015-01-09 ENCOUNTER — Ambulatory Visit (HOSPITAL_COMMUNITY): Payer: Medicare Other | Attending: Cardiovascular Disease

## 2015-01-09 DIAGNOSIS — Z955 Presence of coronary angioplasty implant and graft: Secondary | ICD-10-CM | POA: Diagnosis not present

## 2015-01-09 DIAGNOSIS — I739 Peripheral vascular disease, unspecified: Secondary | ICD-10-CM | POA: Insufficient documentation

## 2015-01-09 DIAGNOSIS — Z87891 Personal history of nicotine dependence: Secondary | ICD-10-CM | POA: Diagnosis not present

## 2015-01-09 DIAGNOSIS — R0600 Dyspnea, unspecified: Secondary | ICD-10-CM

## 2015-01-09 DIAGNOSIS — Z8249 Family history of ischemic heart disease and other diseases of the circulatory system: Secondary | ICD-10-CM | POA: Insufficient documentation

## 2015-01-09 DIAGNOSIS — I2510 Atherosclerotic heart disease of native coronary artery without angina pectoris: Secondary | ICD-10-CM

## 2015-01-15 ENCOUNTER — Telehealth: Payer: Self-pay | Admitting: Cardiovascular Disease

## 2015-01-15 NOTE — Telephone Encounter (Signed)
New message     Pt returning call about echo results

## 2015-01-15 NOTE — Telephone Encounter (Signed)
Echo results reviewed with patient who verbalized understanding and agreement with plan of care

## 2015-02-17 ENCOUNTER — Encounter: Payer: Self-pay | Admitting: Cardiovascular Disease

## 2015-04-01 ENCOUNTER — Other Ambulatory Visit: Payer: Self-pay | Admitting: Neurosurgery

## 2015-04-03 ENCOUNTER — Encounter (HOSPITAL_COMMUNITY): Payer: Self-pay

## 2015-04-03 ENCOUNTER — Encounter (HOSPITAL_COMMUNITY)
Admission: RE | Admit: 2015-04-03 | Discharge: 2015-04-03 | Disposition: A | Discharge status: Home / Self Care | Payer: Medicare Other | Source: Ambulatory Visit | Attending: Neurosurgery | Admitting: Neurosurgery

## 2015-04-03 DIAGNOSIS — Z87891 Personal history of nicotine dependence: Secondary | ICD-10-CM | POA: Insufficient documentation

## 2015-04-03 DIAGNOSIS — Z01818 Encounter for other preprocedural examination: Secondary | ICD-10-CM | POA: Diagnosis present

## 2015-04-03 DIAGNOSIS — Z7982 Long term (current) use of aspirin: Secondary | ICD-10-CM | POA: Insufficient documentation

## 2015-04-03 DIAGNOSIS — F0390 Unspecified dementia without behavioral disturbance: Secondary | ICD-10-CM | POA: Diagnosis not present

## 2015-04-03 DIAGNOSIS — E785 Hyperlipidemia, unspecified: Secondary | ICD-10-CM | POA: Insufficient documentation

## 2015-04-03 DIAGNOSIS — M5126 Other intervertebral disc displacement, lumbar region: Secondary | ICD-10-CM | POA: Insufficient documentation

## 2015-04-03 DIAGNOSIS — Z79899 Other long term (current) drug therapy: Secondary | ICD-10-CM | POA: Diagnosis not present

## 2015-04-03 DIAGNOSIS — I2510 Atherosclerotic heart disease of native coronary artery without angina pectoris: Secondary | ICD-10-CM | POA: Insufficient documentation

## 2015-04-03 DIAGNOSIS — Z01812 Encounter for preprocedural laboratory examination: Secondary | ICD-10-CM | POA: Diagnosis not present

## 2015-04-03 DIAGNOSIS — I252 Old myocardial infarction: Secondary | ICD-10-CM | POA: Insufficient documentation

## 2015-04-03 DIAGNOSIS — Z955 Presence of coronary angioplasty implant and graft: Secondary | ICD-10-CM | POA: Insufficient documentation

## 2015-04-03 HISTORY — DX: Unspecified dementia, unspecified severity, without behavioral disturbance, psychotic disturbance, mood disturbance, and anxiety: F03.90

## 2015-04-03 HISTORY — DX: Unspecified osteoarthritis, unspecified site: M19.90

## 2015-04-03 HISTORY — DX: Myoneural disorder, unspecified: G70.9

## 2015-04-03 HISTORY — DX: Gastro-esophageal reflux disease without esophagitis: K21.9

## 2015-04-03 LAB — BASIC METABOLIC PANEL
ANION GAP: 7 (ref 5–15)
BUN: 18 mg/dL (ref 6–20)
CALCIUM: 9.8 mg/dL (ref 8.9–10.3)
CO2: 27 mmol/L (ref 22–32)
CREATININE: 1.04 mg/dL (ref 0.61–1.24)
Chloride: 107 mmol/L (ref 101–111)
Glucose, Bld: 96 mg/dL (ref 65–99)
Potassium: 4.2 mmol/L (ref 3.5–5.1)
SODIUM: 141 mmol/L (ref 135–145)

## 2015-04-03 LAB — SURGICAL PCR SCREEN
MRSA, PCR: NEGATIVE
STAPHYLOCOCCUS AUREUS: POSITIVE — AB

## 2015-04-03 LAB — CBC
HCT: 45.4 % (ref 39.0–52.0)
HEMOGLOBIN: 14.8 g/dL (ref 13.0–17.0)
MCH: 31 pg (ref 26.0–34.0)
MCHC: 32.6 g/dL (ref 30.0–36.0)
MCV: 95.2 fL (ref 78.0–100.0)
PLATELETS: 209 10*3/uL (ref 150–400)
RBC: 4.77 MIL/uL (ref 4.22–5.81)
RDW: 12.9 % (ref 11.5–15.5)
WBC: 7.4 10*3/uL (ref 4.0–10.5)

## 2015-04-03 NOTE — Progress Notes (Signed)
Spoke with [name] (pt wife) informed her of positive results for PCR screen. Script called into [facility] for M[facility]. And informed  [name] to have med picked up and to start tonight or tomorrow, voices understanding.

## 2015-04-03 NOTE — Progress Notes (Signed)
PCP is Dr Hulan Fess Cardiologist is Dr Mertie Moores Stress test 07-21-10 Echo 01-09-15 Card cath 2009

## 2015-04-03 NOTE — Pre-Procedure Instructions (Signed)
Alexander [name]  04/03/2015      [id number] Alexander [name], [place] [address] [place] [address] Phone: [phone] Fax: [phone]    Your procedure is scheduled on Dec 8  Report to [facility] at 900 A.M.  Call this number if you have problems the morning of surgery:  [phone]   Remember:  Do not eat food or drink liquids after midnight.  Take these medicines the morning of surgery with A SIP OF WATER famotidine (Pepcid), gabapentin (Neurontin), metoprolol (Lopressor), Nitroglycerin if needed  Stop taking aspirin, BC's, Goody's, Ibuprofen, Advil, Motrin, Aleve, Naprosyn, Herbal medication, Vitamins, Fish Oil   Do not wear jewelry, make-up or nail polish.  Do not wear lotions, powders, or perfumes.  You may wear deodorant.   Men may shave face and neck.  Do not bring valuables to the hospital.  [facility] is not responsible for any belongings or valuables.  Contacts, dentures or bridgework may not be worn into surgery.  Leave your suitcase in the car.  After surgery it may be brought to your room.  For patients admitted to the hospital, discharge time will be determined by your treatment team.  Patients discharged the day of surgery will not be allowed to drive home.    Special instructions:  Alexander [name] - Preparing for Surgery  Before surgery, you can play an important role.  Because skin is not sterile, your skin needs to be as free of germs as possible.  You can reduce the number of germs on you skin by washing with CHG (chlorahexidine gluconate) soap before surgery.  CHG is an antiseptic cleaner which kills germs and bonds with the skin to continue killing germs even after washing.  Please DO NOT use if you have an allergy to CHG or antibacterial soaps.  If your skin becomes reddened/irritated stop using the CHG and inform your nurse when you arrive at Short Stay.  Do not shave (including legs and underarms) for  at least 48 hours prior to the first CHG shower.  You may shave your face.  Please follow these instructions carefully:   1.  Shower with CHG Soap the night before surgery and the    morning of Surgery.  2.  If you choose to wash your hair, wash your hair first as usual with your  normal shampoo.  3.  After you shampoo, rinse your hair and body thoroughly to remove the   Shampoo.  4.  Use CHG as you would any other liquid soap.  You can apply chg directly to the skin and wash gently with scrungie or a clean washcloth.  5.  Apply the CHG Soap to your body ONLY FROM THE NECK DOWN.        Do not use on open wounds or open sores.  Avoid contact with your eyes,  ears, mouth and genitals (private parts).  Wash genitals (private parts)  with your normal soap.  6.  Wash thoroughly, paying special attention to the area where your surgery  will be performed.  7.  Thoroughly rinse your body with warm water from the neck down.  8.  DO NOT shower/wash with your normal soap after using and rinsing off the CHG Soap.  9.  Pat yourself dry with a clean towel.            10.  Wear clean pajamas.  11.  Place clean sheets on your bed the night of your first shower and do not  sleep with pets.  Day of Surgery  Do not apply any lotions/deoderants the morning of surgery.  Please wear clean clothes to the hospital/surgery center.     Please read over the following fact sheets that you were given. Pain Booklet, Coughing and Deep Breathing, MRSA Information and Surgical Site Infection Prevention

## 2015-04-04 NOTE — Progress Notes (Signed)
Anesthesia Chart Review:  Pt is 74 year old male scheduled for L4-5 lumbar laminectomy/ decompression microdiscectomy on 04/10/15 with Dr. Sherwood Gambler.   Cardiologist is Dr. Grayland Jack, last office visit 12/29/14. PVD is followed by Dr. Kathlyn [place], last office visit 09/17/14.  PCP is Dr. Hulan Fess.    PMH includes:  CAD (MI, stent to RCA 09/2007, cutting balloon to PDA 10/2007), hyperlipidemia, dementia. Former smoker. BMI 23.  Medications include: ASA, pepcid, metoprolol, rosuvastatin.   Preoperative labs reviewed.    [place] 12/30/14: sinus rhythm with occasional PVCs  Echo 01/09/15:  - Left ventricle: Inferobasal hypokinesis. The cavity size was normal. Wall thickness was normal. Systolic function was normal. The estimated ejection fraction was in the range of 50% to 55%. Wall motion was normal; there were no regional wall motion abnormalities. Doppler parameters are consistent with abnormal left ventricular relaxation (grade 1 diastolic dysfunction). - Atrial septum: No defect or patent foramen ovale was identified.  Aorto-iliac duplex US 06/25/14:  - Normal caliber abdominal aorta, common and external iliac arteries, without focal stenosis, or dilatation. - Aorto-iliac atherosclerosis, without stenosis. - >50% right common iliac artery stenosis. - Occluded left common iliac artery.  Nuclear stress test 07/21/10: Low risk stress nuclear study.  Cardiac cath 09/06/07:  1. Acute inferior myocardial infarction with successful stent placed in the right coronary artery. 2. Well-preserved global left ventricular function. 3. Residual 80% stenosis in an acute marginal vessel/posterior descending vessel of the right coronary artery with mild irregularities, otherwise, in the left coronary tree.  If no changes, I anticipate pt can proceed with surgery as scheduled.   Willeen Cass, FNP-BC [facility] Short Stay Surgical Center/Anesthesiology Phone: [phone] 04/04/2015 1:17 PM

## 2015-04-09 MED ORDER — CEFAZOLIN SODIUM-DEXTROSE 2-3 GM-% IV SOLR
2.0000 g | INTRAVENOUS | Status: AC
  Administered 2015-04-10: 2 g via INTRAVENOUS
  Filled 2015-04-09: qty 50

## 2015-04-10 ENCOUNTER — Observation Stay (HOSPITAL_COMMUNITY): Payer: Medicare Other

## 2015-04-10 ENCOUNTER — Encounter (HOSPITAL_COMMUNITY)
Admission: RE | Disposition: A | Discharge status: Home / Self Care | Payer: Self-pay | Source: Ambulatory Visit | Attending: Neurosurgery

## 2015-04-10 ENCOUNTER — Ambulatory Visit (HOSPITAL_COMMUNITY): Payer: Medicare Other | Admitting: Vascular Surgery

## 2015-04-10 ENCOUNTER — Encounter (HOSPITAL_COMMUNITY): Payer: Self-pay | Admitting: Surgery

## 2015-04-10 ENCOUNTER — Ambulatory Visit (HOSPITAL_COMMUNITY): Payer: Medicare Other | Admitting: Anesthesiology

## 2015-04-10 ENCOUNTER — Observation Stay (HOSPITAL_COMMUNITY)
Admission: RE | Admit: 2015-04-10 | Discharge: 2015-04-11 | Disposition: A | Discharge status: Home / Self Care | Payer: Medicare Other | Source: Ambulatory Visit | Attending: Neurosurgery | Admitting: Neurosurgery

## 2015-04-10 DIAGNOSIS — I252 Old myocardial infarction: Secondary | ICD-10-CM | POA: Insufficient documentation

## 2015-04-10 DIAGNOSIS — M47896 Other spondylosis, lumbar region: Secondary | ICD-10-CM | POA: Insufficient documentation

## 2015-04-10 DIAGNOSIS — M4806 Spinal stenosis, lumbar region: Secondary | ICD-10-CM | POA: Diagnosis not present

## 2015-04-10 DIAGNOSIS — M5126 Other intervertebral disc displacement, lumbar region: Secondary | ICD-10-CM | POA: Diagnosis not present

## 2015-04-10 DIAGNOSIS — M21372 Foot drop, left foot: Secondary | ICD-10-CM | POA: Diagnosis not present

## 2015-04-10 DIAGNOSIS — I2510 Atherosclerotic heart disease of native coronary artery without angina pectoris: Secondary | ICD-10-CM | POA: Diagnosis not present

## 2015-04-10 DIAGNOSIS — Z87891 Personal history of nicotine dependence: Secondary | ICD-10-CM | POA: Diagnosis not present

## 2015-04-10 DIAGNOSIS — Z79899 Other long term (current) drug therapy: Secondary | ICD-10-CM | POA: Diagnosis not present

## 2015-04-10 DIAGNOSIS — M5116 Intervertebral disc disorders with radiculopathy, lumbar region: Principal | ICD-10-CM | POA: Insufficient documentation

## 2015-04-10 DIAGNOSIS — Z7982 Long term (current) use of aspirin: Secondary | ICD-10-CM | POA: Insufficient documentation

## 2015-04-10 DIAGNOSIS — G709 Myoneural disorder, unspecified: Secondary | ICD-10-CM | POA: Diagnosis not present

## 2015-04-10 DIAGNOSIS — K219 Gastro-esophageal reflux disease without esophagitis: Secondary | ICD-10-CM | POA: Insufficient documentation

## 2015-04-10 DIAGNOSIS — F0390 Unspecified dementia without behavioral disturbance: Secondary | ICD-10-CM | POA: Diagnosis not present

## 2015-04-10 DIAGNOSIS — M549 Dorsalgia, unspecified: Secondary | ICD-10-CM

## 2015-04-10 DIAGNOSIS — M1990 Unspecified osteoarthritis, unspecified site: Secondary | ICD-10-CM | POA: Insufficient documentation

## 2015-04-10 DIAGNOSIS — I739 Peripheral vascular disease, unspecified: Secondary | ICD-10-CM | POA: Insufficient documentation

## 2015-04-10 DIAGNOSIS — E785 Hyperlipidemia, unspecified: Secondary | ICD-10-CM | POA: Diagnosis not present

## 2015-04-10 HISTORY — PX: LUMBAR LAMINECTOMY/DECOMPRESSION MICRODISCECTOMY: SHX5026

## 2015-04-10 SURGERY — LUMBAR LAMINECTOMY/DECOMPRESSION MICRODISCECTOMY 1 LEVEL
Anesthesia: General | Site: Back | Laterality: Left

## 2015-04-10 MED ORDER — PROPOFOL 10 MG/ML IV BOLUS
INTRAVENOUS | Status: DC | PRN
  Administered 2015-04-10: 100 mg via INTRAVENOUS

## 2015-04-10 MED ORDER — OXYCODONE-ACETAMINOPHEN 5-325 MG PO TABS
1.0000 | ORAL_TABLET | ORAL | Status: DC | PRN
  Administered 2015-04-11: 2 via ORAL
  Filled 2015-04-10: qty 2

## 2015-04-10 MED ORDER — HYDROMORPHONE HCL 1 MG/ML IJ SOLN: 0.2500 mg | INTRAMUSCULAR | Status: DC | PRN

## 2015-04-10 MED ORDER — LIDOCAINE HCL (CARDIAC) 20 MG/ML IV SOLN
INTRAVENOUS | Status: DC | PRN
  Administered 2015-04-10: 60 mg via INTRAVENOUS

## 2015-04-10 MED ORDER — FENTANYL CITRATE (PF) 100 MCG/2ML IJ SOLN
INTRAMUSCULAR | Status: DC | PRN
  Administered 2015-04-10 (×3): 50 ug via INTRAVENOUS

## 2015-04-10 MED ORDER — LIDOCAINE-EPINEPHRINE 1 %-1:100000 IJ SOLN
INTRAMUSCULAR | Status: DC | PRN
Start: 2015-04-10 — End: 2015-04-10
  Administered 2015-04-10: 5 mL

## 2015-04-10 MED ORDER — HYDROXYZINE HCL 25 MG PO TABS: 50.0000 mg | ORAL_TABLET | ORAL | Status: DC | PRN

## 2015-04-10 MED ORDER — ROCURONIUM BROMIDE 50 MG/5ML IV SOLN
INTRAVENOUS | Status: AC
  Filled 2015-04-10: qty 1

## 2015-04-10 MED ORDER — MORPHINE SULFATE (PF) 4 MG/ML IV SOLN: 4.0000 mg | INTRAVENOUS | Status: DC | PRN

## 2015-04-10 MED ORDER — FENTANYL CITRATE (PF) 100 MCG/2ML IJ SOLN
INTRAMUSCULAR | Status: DC | PRN
  Administered 2015-04-10: 100 ug via INTRAVENOUS

## 2015-04-10 MED ORDER — PROMETHAZINE HCL 25 MG/ML IJ SOLN: 6.2500 mg | INTRAMUSCULAR | Status: DC | PRN

## 2015-04-10 MED ORDER — HEMOSTATIC AGENTS (NO CHARGE) OPTIME
TOPICAL | Status: DC | PRN
  Administered 2015-04-10: 1 via TOPICAL

## 2015-04-10 MED ORDER — METHYLPREDNISOLONE ACETATE 80 MG/ML IJ SUSP
INTRAMUSCULAR | Status: DC | PRN
  Administered 2015-04-10: 80 mg

## 2015-04-10 MED ORDER — KETOROLAC TROMETHAMINE 30 MG/ML IJ SOLN
30.0000 mg | Freq: Four times a day (QID) | INTRAMUSCULAR | Status: DC
  Administered 2015-04-10 – 2015-04-11 (×3): 30 mg via INTRAVENOUS
  Filled 2015-04-10 (×3): qty 1

## 2015-04-10 MED ORDER — PHENOL 1.4 % MT LIQD: 1.0000 | OROMUCOSAL | Status: DC | PRN

## 2015-04-10 MED ORDER — THROMBIN 5000 UNITS EX SOLR
CUTANEOUS | Status: DC | PRN
  Administered 2015-04-10 (×2): 5000 [IU] via TOPICAL

## 2015-04-10 MED ORDER — NITROGLYCERIN 0.4 MG SL SUBL: 0.4000 mg | SUBLINGUAL_TABLET | SUBLINGUAL | Status: DC | PRN

## 2015-04-10 MED ORDER — BISACODYL 10 MG RE SUPP: 10.0000 mg | Freq: Every day | RECTAL | Status: DC | PRN

## 2015-04-10 MED ORDER — ACETAMINOPHEN 650 MG RE SUPP: 650.0000 mg | RECTAL | Status: DC | PRN

## 2015-04-10 MED ORDER — BUPIVACAINE HCL (PF) 0.5 % IJ SOLN
INTRAMUSCULAR | Status: DC | PRN
  Administered 2015-04-10: 5 mL

## 2015-04-10 MED ORDER — MAGNESIUM HYDROXIDE 400 MG/5ML PO SUSP
30.0000 mL | Freq: Every day | ORAL | Status: DC | PRN
Start: 2015-04-10 — End: 2015-04-11

## 2015-04-10 MED ORDER — GELATIN ABSORBABLE MT POWD
OROMUCOSAL | Status: DC | PRN
  Administered 2015-04-10: 10 mL via TOPICAL

## 2015-04-10 MED ORDER — NEOSTIGMINE METHYLSULFATE 10 MG/10ML IV SOLN
INTRAVENOUS | Status: DC | PRN
  Administered 2015-04-10: 3 mg via INTRAVENOUS

## 2015-04-10 MED ORDER — ONDANSETRON HCL 4 MG/2ML IJ SOLN
INTRAMUSCULAR | Status: AC
  Filled 2015-04-10: qty 2

## 2015-04-10 MED ORDER — ROCURONIUM BROMIDE 100 MG/10ML IV SOLN
INTRAVENOUS | Status: DC | PRN
  Administered 2015-04-10: 50 mg via INTRAVENOUS

## 2015-04-10 MED ORDER — FENTANYL CITRATE (PF) 250 MCG/5ML IJ SOLN
INTRAMUSCULAR | Status: AC
  Filled 2015-04-10: qty 5

## 2015-04-10 MED ORDER — LACTATED RINGERS IV SOLN
INTRAVENOUS | Status: DC
  Administered 2015-04-10 (×2): via INTRAVENOUS

## 2015-04-10 MED ORDER — EPHEDRINE SULFATE 50 MG/ML IJ SOLN
INTRAMUSCULAR | Status: DC | PRN
  Administered 2015-04-10: 10 mg via INTRAVENOUS

## 2015-04-10 MED ORDER — KCL IN DEXTROSE-NACL 20-5-0.45 MEQ/L-%-% IV SOLN
INTRAVENOUS | Status: DC
  Filled 2015-04-10 (×2): qty 1000

## 2015-04-10 MED ORDER — GLYCOPYRROLATE 0.2 MG/ML IJ SOLN
INTRAMUSCULAR | Status: DC | PRN
  Administered 2015-04-10: 0.4 mg via INTRAVENOUS

## 2015-04-10 MED ORDER — ONDANSETRON HCL 4 MG PO TABS: 4.0000 mg | ORAL_TABLET | Freq: Four times a day (QID) | ORAL | Status: DC | PRN

## 2015-04-10 MED ORDER — FAMOTIDINE 40 MG PO TABS
40.0000 mg | ORAL_TABLET | Freq: Every day | ORAL | Status: DC
  Filled 2015-04-10 (×2): qty 1

## 2015-04-10 MED ORDER — ROSUVASTATIN CALCIUM 40 MG PO TABS
40.0000 mg | ORAL_TABLET | Freq: Every day | ORAL | Status: DC
  Filled 2015-04-10: qty 1

## 2015-04-10 MED ORDER — ALUM & MAG HYDROXIDE-SIMETH 200-200-20 MG/5ML PO SUSP: 30.0000 mL | Freq: Four times a day (QID) | ORAL | Status: DC | PRN

## 2015-04-10 MED ORDER — FENTANYL CITRATE (PF) 100 MCG/2ML IJ SOLN
INTRAMUSCULAR | Status: AC
  Filled 2015-04-10: qty 2

## 2015-04-10 MED ORDER — 0.9 % SODIUM CHLORIDE (POUR BTL) OPTIME
TOPICAL | Status: DC | PRN
  Administered 2015-04-10: 1000 mL

## 2015-04-10 MED ORDER — ACETAMINOPHEN 325 MG PO TABS: 650.0000 mg | ORAL_TABLET | ORAL | Status: DC | PRN

## 2015-04-10 MED ORDER — SODIUM CHLORIDE 0.9 % IJ SOLN
3.0000 mL | Freq: Two times a day (BID) | INTRAMUSCULAR | Status: DC
  Administered 2015-04-10: 3 mL via INTRAVENOUS

## 2015-04-10 MED ORDER — GABAPENTIN 300 MG PO CAPS
300.0000 mg | ORAL_CAPSULE | Freq: Two times a day (BID) | ORAL | Status: DC
  Administered 2015-04-10: 300 mg via ORAL
  Filled 2015-04-10: qty 1

## 2015-04-10 MED ORDER — LIDOCAINE HCL (CARDIAC) 20 MG/ML IV SOLN
INTRAVENOUS | Status: AC
  Filled 2015-04-10: qty 5

## 2015-04-10 MED ORDER — VITAMIN C 250 MG PO TABS
250.0000 mg | ORAL_TABLET | Freq: Every day | ORAL | Status: DC
  Filled 2015-04-10: qty 1

## 2015-04-10 MED ORDER — ACETAMINOPHEN 10 MG/ML IV SOLN
INTRAVENOUS | Status: AC
Start: 2015-04-10 — End: 2015-04-10
  Administered 2015-04-10: 1000 mg via INTRAVENOUS
  Filled 2015-04-10: qty 100

## 2015-04-10 MED ORDER — HYDROCODONE-ACETAMINOPHEN 5-325 MG PO TABS: 1.0000 | ORAL_TABLET | ORAL | Status: DC | PRN

## 2015-04-10 MED ORDER — SODIUM CHLORIDE 0.9 % IR SOLN
Status: DC | PRN
  Administered 2015-04-10: 500 mL

## 2015-04-10 MED ORDER — MIDAZOLAM HCL 2 MG/2ML IJ SOLN
INTRAMUSCULAR | Status: AC
  Filled 2015-04-10: qty 2

## 2015-04-10 MED ORDER — METOPROLOL TARTRATE 25 MG PO TABS
25.0000 mg | ORAL_TABLET | Freq: Two times a day (BID) | ORAL | Status: DC
  Administered 2015-04-10: 25 mg via ORAL
  Filled 2015-04-10: qty 1

## 2015-04-10 MED ORDER — VITAMIN D3 25 MCG (1000 UNIT) PO TABS
1000.0000 [IU] | ORAL_TABLET | Freq: Every day | ORAL | Status: DC
  Filled 2015-04-10: qty 1

## 2015-04-10 MED ORDER — MENTHOL 3 MG MT LOZG: 1.0000 | LOZENGE | OROMUCOSAL | Status: DC | PRN

## 2015-04-10 MED ORDER — ONDANSETRON HCL 4 MG/2ML IJ SOLN: 4.0000 mg | Freq: Four times a day (QID) | INTRAMUSCULAR | Status: DC | PRN

## 2015-04-10 MED ORDER — KETOROLAC TROMETHAMINE 30 MG/ML IJ SOLN: 15.0000 mg | Freq: Once | INTRAMUSCULAR | Status: DC | PRN

## 2015-04-10 MED ORDER — CYCLOBENZAPRINE HCL 10 MG PO TABS
10.0000 mg | ORAL_TABLET | Freq: Three times a day (TID) | ORAL | Status: DC | PRN
  Administered 2015-04-11: 10 mg via ORAL
  Filled 2015-04-10: qty 1

## 2015-04-10 MED ORDER — KETOROLAC TROMETHAMINE 30 MG/ML IJ SOLN
INTRAMUSCULAR | Status: AC
  Administered 2015-04-10: 30 mg
  Filled 2015-04-10: qty 1

## 2015-04-10 MED ORDER — HYDROXYZINE HCL 50 MG/ML IM SOLN
50.0000 mg | INTRAMUSCULAR | Status: DC | PRN
  Filled 2015-04-10: qty 1

## 2015-04-10 MED ORDER — SUCCINYLCHOLINE CHLORIDE 20 MG/ML IJ SOLN
INTRAMUSCULAR | Status: AC
  Filled 2015-04-10: qty 1

## 2015-04-10 MED ORDER — PROPOFOL 10 MG/ML IV BOLUS
INTRAVENOUS | Status: AC
  Filled 2015-04-10: qty 40

## 2015-04-10 MED ORDER — SODIUM CHLORIDE 0.9 % IV SOLN
250.0000 mL | INTRAVENOUS | Status: DC
Start: 2015-04-10 — End: 2015-04-11

## 2015-04-10 MED ORDER — KETOROLAC TROMETHAMINE 30 MG/ML IJ SOLN: 30.0000 mg | Freq: Once | INTRAMUSCULAR | Status: DC

## 2015-04-10 MED ORDER — SODIUM CHLORIDE 0.9 % IJ SOLN: 3.0000 mL | INTRAMUSCULAR | Status: DC | PRN

## 2015-04-10 MED ORDER — ONDANSETRON HCL 4 MG/2ML IJ SOLN
INTRAMUSCULAR | Status: DC | PRN
  Administered 2015-04-10: 4 mg via INTRAVENOUS

## 2015-04-10 SURGICAL SUPPLY — 54 items
BAG DECANTER FOR FLEXI CONT (MISCELLANEOUS) ×2 IMPLANT
BENZOIN TINCTURE PRP APPL 2/3 (GAUZE/BANDAGES/DRESSINGS) ×2 IMPLANT
BLADE CLIPPER SURG (BLADE) IMPLANT
BRUSH SCRUB EZ PLAIN DRY (MISCELLANEOUS) ×2 IMPLANT
BUR ACORN 6.0 ACORN (BURR) IMPLANT
BUR ACRON 5.0MM COATED (BURR) IMPLANT
BUR MATCHSTICK NEURO 3.0 LAGG (BURR) ×2 IMPLANT
CANISTER SUCT 3000ML PPV (MISCELLANEOUS) ×2 IMPLANT
DERMABOND ADVANCED (GAUZE/BANDAGES/DRESSINGS) ×1
DERMABOND ADVANCED .7 DNX12 (GAUZE/BANDAGES/DRESSINGS) ×1 IMPLANT
DRAPE LAPAROTOMY 100X72X124 (DRAPES) ×2 IMPLANT
DRAPE MICROSCOPE LEICA (MISCELLANEOUS) ×2 IMPLANT
DRAPE POUCH INSTRU U-SHP 10X18 (DRAPES) ×2 IMPLANT
DRSG EMULSION OIL 3X3 NADH (GAUZE/BANDAGES/DRESSINGS) IMPLANT
ELECT REM PT RETURN 9FT ADLT (ELECTROSURGICAL) ×2
ELECTRODE REM PT RTRN 9FT ADLT (ELECTROSURGICAL) ×1 IMPLANT
GAUZE SPONGE 4X4 12PLY STRL (GAUZE/BANDAGES/DRESSINGS) IMPLANT
GAUZE SPONGE 4X4 16PLY XRAY LF (GAUZE/BANDAGES/DRESSINGS) IMPLANT
GLOVE BIOGEL PI IND STRL 8 (GLOVE) ×1 IMPLANT
GLOVE BIOGEL PI INDICATOR 8 (GLOVE) ×1
GLOVE ECLIPSE 7.5 STRL STRAW (GLOVE) ×2 IMPLANT
GLOVE EXAM NITRILE LRG STRL (GLOVE) IMPLANT
GLOVE EXAM NITRILE MD LF STRL (GLOVE) IMPLANT
GLOVE EXAM NITRILE XL STR (GLOVE) IMPLANT
GLOVE EXAM NITRILE XS STR PU (GLOVE) IMPLANT
GOWN STRL REUS W/ TWL LRG LVL3 (GOWN DISPOSABLE) ×1 IMPLANT
GOWN STRL REUS W/ TWL XL LVL3 (GOWN DISPOSABLE) IMPLANT
GOWN STRL REUS W/TWL 2XL LVL3 (GOWN DISPOSABLE) IMPLANT
GOWN STRL REUS W/TWL LRG LVL3 (GOWN DISPOSABLE) ×1
GOWN STRL REUS W/TWL XL LVL3 (GOWN DISPOSABLE)
KIT BASIN OR (CUSTOM PROCEDURE TRAY) ×2 IMPLANT
KIT ROOM TURNOVER OR (KITS) ×2 IMPLANT
NEEDLE HYPO 18GX1.5 BLUNT FILL (NEEDLE) IMPLANT
NEEDLE SPNL 18GX3.5 QUINCKE PK (NEEDLE) ×2 IMPLANT
NEEDLE SPNL 22GX3.5 QUINCKE BK (NEEDLE) ×2 IMPLANT
NS IRRIG 1000ML POUR BTL (IV SOLUTION) ×2 IMPLANT
PACK LAMINECTOMY NEURO (CUSTOM PROCEDURE TRAY) ×2 IMPLANT
PAD ARMBOARD 7.5X6 YLW CONV (MISCELLANEOUS) ×6 IMPLANT
PATTIES SURGICAL .5 X1 (DISPOSABLE) ×2 IMPLANT
RUBBERBAND STERILE (MISCELLANEOUS) ×4 IMPLANT
SPONGE LAP 4X18 X RAY DECT (DISPOSABLE) IMPLANT
SPONGE SURGIFOAM ABS GEL SZ50 (HEMOSTASIS) ×2 IMPLANT
STRIP CLOSURE SKIN 1/2X4 (GAUZE/BANDAGES/DRESSINGS) IMPLANT
SUT PROLENE 6 0 BV (SUTURE) IMPLANT
SUT VIC AB 1 CT1 18XBRD ANBCTR (SUTURE) ×1 IMPLANT
SUT VIC AB 1 CT1 8-18 (SUTURE) ×1
SUT VIC AB 2-0 CP2 18 (SUTURE) ×2 IMPLANT
SUT VIC AB 3-0 SH 8-18 (SUTURE) IMPLANT
SYR 5ML LL (SYRINGE) IMPLANT
TAPE CLOTH SURG 4X10 WHT LF (GAUZE/BANDAGES/DRESSINGS) ×2 IMPLANT
TAPE STRIPS DRAPE STRL (GAUZE/BANDAGES/DRESSINGS) ×2 IMPLANT
TOWEL OR 17X24 6PK STRL BLUE (TOWEL DISPOSABLE) ×2 IMPLANT
TOWEL OR 17X26 10 PK STRL BLUE (TOWEL DISPOSABLE) ×2 IMPLANT
WATER STERILE IRR 1000ML POUR (IV SOLUTION) ×2 IMPLANT

## 2015-04-10 NOTE — Op Note (Signed)
04/10/2015  1:53 PM  PATIENT:  Alexander [name]  74 y.o. 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  ANESTHESIA:   general  EBL:  50 mL  BLOOD ADMINISTERED:none  COUNT: Correct per nursing staff  DICTATION: Patient was brought to the operating room and placed under general endotracheal anesthesia. Patient was turned to prone position the lumbar region was prepped with Betadine soap and solution and draped in a sterile fashion. The midline was infiltrated with local anesthetic with epinephrine. A localizing x-ray was taken and the L4-5 level was identified. Midline incision was made over the L4-5 level and was carried down through the subcutaneous tissue to the lumbar fascia. The lumbar fascia was incised on the left side and the paraspinal muscles were dissected from the spinous processes and lamina in a subperiosteal fashion. Another x-ray was taken and the L4-5 intralaminar space was identified. The operating microscope was draped and brought into the field provided additional magnification, illumination, and visualization. A left L4 laminotomy was performed using the high-speed drill and Kerrison punches. The ligamentum flavum was carefully resected. The underlying thecal sac and left L4 nerve root were identified. The disc herniation was identified in the ventral epidural space behind the L4 vertebral body, and extending into the left L4-5 neural foramen.  The thecal sac and nerve root gently retracted  medially. We began to mobilize the disc herniation using microhooks and it was removed in a piecemeal fashion. All loose fragments within the disc herniation were removed, and the thecal sac and exiting left L4 nerve root were decompressed. Once the discectomy was completed and good decompression of the thecal sac and nerve had been achieved hemostasis was established with the use of bipolar cautery, Gelfoam with thrombin, and Surgifoam. The Gelfoam was removed and hemostasis confirmed. We then instilled 2 cc of fentanyl and 80 mg of Depo-Medrol into the epidural space. Deep fascia was closed with interrupted undyed 1 Vicryl sutures. Scarpa's fascia was closed with interrupted undyed 1 Vicryl sutures in the subcutaneous and subcuticular layer were closed with interrupted inverted 2-0 undyed Vicryl sutures. The skin edges were approximated with Dermabond. Following surgery the patient was turned back to a supine position to be reversed from the anesthetic extubated and transferred to the recovery room for further care.   PLAN OF CARE: Admit for overnight observation  PATIENT DISPOSITION:  PACU - hemodynamically stable.   Delay start of Pharmacological VTE agent (>24hrs) due to surgical blood loss or risk of bleeding:  yes

## 2015-04-10 NOTE — Anesthesia Preprocedure Evaluation (Signed)
Anesthesia Evaluation  Patient identified by MRN, date of birth, ID band Patient awake    Reviewed: Allergy & Precautions, NPO status , Patient's Chart, lab work & pertinent test results  Airway Mallampati: II  TM Distance: >3 FB Neck ROM: Full    Dental no notable dental hx.    Pulmonary neg pulmonary ROS, former smoker,    Pulmonary exam normal breath sounds clear to auscultation       Cardiovascular + CAD, + Past MI and + Cardiac Stents  Normal cardiovascular exam Rhythm:Regular Rate:Normal     Neuro/Psych negative neurological ROS  negative psych ROS   GI/Hepatic negative GI ROS, Neg liver ROS,   Endo/Other  negative endocrine ROS  Renal/GU negative Renal ROS  negative genitourinary   Musculoskeletal negative musculoskeletal ROS (+)   Abdominal   Peds negative pediatric ROS (+)  Hematology negative hematology ROS (+)   Anesthesia Other Findings   Reproductive/Obstetrics negative OB ROS                             Anesthesia Physical Anesthesia Plan  ASA: III  Anesthesia Plan: General   Post-op Pain Management:    Induction: Intravenous  Airway Management Planned: Oral ETT  Additional Equipment:   Intra-op Plan:   Post-operative Plan: Extubation in OR  Informed Consent: I have reviewed the patients History and Physical, chart, labs and discussed the procedure including the risks, benefits and alternatives for the proposed anesthesia with the patient or authorized representative who has indicated his/her understanding and acceptance.   Dental advisory given  Plan Discussed with: CRNA and Surgeon  Anesthesia Plan Comments:         Anesthesia Quick Evaluation

## 2015-04-10 NOTE — Progress Notes (Signed)
Report given to maryann rn as caregiver 

## 2015-04-10 NOTE — Progress Notes (Signed)
Filed Vitals:   04/10/15 1515 04/10/15 1530 04/10/15 1600 04/10/15 1626  BP: 118/46 120/53 133/63 124/62  Pulse: 51 54 56 57  Temp:   97.6 F (36.4 C)   TempSrc:      Resp: 10 10 16 16   SpO2: 96% 94% 94% 95%    Patient resting in bed. Comfortable. Wound clean and dry. No void yet, nursing staff to monitor voiding function. Has not yet ambulated.  Plan: Encouraged to ambulate in the halls with a staff this evening and again in the morning. Will continue to progress through postoperative recovery.  Hosie Spangle, MD 04/10/2015, 5:06 PM

## 2015-04-10 NOTE — Transfer of Care (Signed)
Immediate Anesthesia Transfer of Care Note  Patient: Alexander [name]  Procedure(s) Performed: Procedure(s) with comments: LUMBAR LAMINECTOMY/DECOMPRESSION MICRODISCECTOMY 1 LEVEL (Left) - Left L45 laminotomy  Patient Location: PACU  Anesthesia Type:General  Level of Consciousness: awake, alert , oriented and sedated  Airway & Oxygen Therapy: Patient Spontanous Breathing and Patient connected to nasal cannula oxygen  Post-op Assessment: Report given to RN, Post -op Vital signs reviewed and stable and Patient moving all extremities  Post vital signs: Reviewed and stable  Last Vitals:  Filed Vitals:   04/10/15 0904 04/10/15 1407  BP: 121/63   Pulse: 68   Temp: 36.4 C 36.3 C  Resp: 18     Complications: No apparent anesthesia complications

## 2015-04-10 NOTE — H&P (Signed)
Subjective: Patient is a 74 y.o. right-handed white male who is admitted for treatment of acute left L4-5 lumbar disc herniation with associated left lumbar radiculopathy with significant weakness.  Patient has been under my care since February 2009, and he is status post a right L3-4 extraforaminal microdiscectomy in February 2009 and status post a right L5-S1 lumbar laminotomy and micro-discectomy in May 2012. 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 About 5-6 weeks ago he developed weakness in the distal left lower extremity and return for reevaluation. MRI again shows extensive multilevel degenerative changes and in particular marked multifactorial lumbar stenosis at L2-3 and L3-4, but in comparison to his most recent previous MRI there is a new large free fragment disc herniation on the left side at the L4-5 level that extends rostrally behind the body of L4, extending towards the left L4-5 neural foramen, with significant thecal sac and nerve root compression corresponding to his left L5 radiculopathy and left foot drop. He is admitted now for a left L4-5 lumbar laminotomy and microdiscectomy.   Patient Active Problem List   Diagnosis Date Noted  . PAD (peripheral artery disease) ([place]) 05/09/2014  . CAD (coronary artery disease) 03/03/2011  . Hyperlipidemia 03/03/2011   Past Medical History  Diagnosis Date  . Coronary artery disease     Stent to prox RCA 2009 with cutting balloon to PDA  . Acute inferior myocardial infarction [facility])      Stent to right coronary on Sep 06, 2007 with 3.5 x 20 mm Liberte non-drug eluting stent  . Hyperlipidemia   . History of tobacco abuse   . Dementia     memory problems  . GERD (gastroesophageal reflux disease)   . Arthritis   . Neuromuscular disorder [facility])     Past Surgical History  Procedure Laterality Date  . Back surgery   February  2009  . Tonsillectomy    . Belpharoptosis repair    . Cardiac catheterization  2009    Prescriptions prior to admission  Medication Sig Dispense Refill Last Dose  . aspirin 81 MG tablet Take 81 mg by mouth daily.   Past Week at Unknown time  . calcium citrate (CALCITRATE - DOSED IN MG ELEMENTAL CALCIUM) 950 MG tablet Take 200 mg of elemental calcium by mouth daily.   Past Week at Unknown time  . cholecalciferol (VITAMIN D) 1000 UNITS tablet Take 1,000 Units by mouth daily.   Past Week at Unknown time  . famotidine (PEPCID) 40 MG tablet Take 40 mg by mouth daily.   Past Week at Unknown time  . gabapentin (NEURONTIN) 300 MG capsule Take 300 mg by mouth 2 (two) times daily.   04/10/2015 at 0700  . metoprolol (LOPRESSOR) 50 MG tablet Take 25 mg by mouth 2 (two) times daily.   04/10/2015 at 0700  . Multiple Vitamin (MULTIVITAMIN WITH MINERALS) TABS tablet Take 1 tablet by mouth daily.   Past Week at Unknown time  . naproxen (NAPROSYN) 500 MG tablet Take 500 mg by mouth 2 (two) times daily with a meal.   Past Week at Unknown time  . nitroGLYCERIN (NITROSTAT) 0.4 MG SL tablet Place 1 tablet (0.4 mg total) under the tongue every 5 (five) minutes as needed for chest pain. 25 tablet 3 Taking  . Omega-3 Fatty Acids (FISH OIL) 1000 MG CAPS Take 1,000 mg by mouth daily.   Past Week at Unknown time  . vitamin  C (ASCORBIC ACID) 250 MG tablet Take 250 mg by mouth daily.   Past Week at Unknown time  . rosuvastatin (CRESTOR) 40 MG tablet Take 40 mg by mouth daily.   Unknown at Unknown time   No Known Allergies  Social History  Substance Use Topics  . Smoking status: Former Research scientist (life sciences)  . Smokeless tobacco: Not on file  . Alcohol Use: Yes     Comment: social drinks wine    Family History  Problem Relation Age of Onset  . Heart attack Father 62  . Cancer Mother   . Cancer Brother      Review of Systems A comprehensive review of systems was negative.  Objective: Vital signs in last 24 hours: Temp:  [97.5  F (36.4 C)] 97.5 F (36.4 C) (12/08 0904) Pulse Rate:  [68] 68 (12/08 0904) Resp:  [18] 18 (12/08 0904) BP: (121)/(63) 121/63 mmHg (12/08 0904) SpO2:  [97 %] 97 % (12/08 0904)  EXAM: Patient is well-developed well-nourished white male in no acute distress. Lungs are clear to auscultation , the patient has symmetrical respiratory excursion. Heart has a regular rate and rhythm normal S1 and S2 no murmur.   Abdomen is soft nontender nondistended bowel sounds are present. Extremity examination shows no clubbing cyanosis or edema. Motor examination shows 5/5 strength in the iliopsoas and quadriceps bilaterally, as well as with the right dorsal flexor, EHL, and plantar flexor. However the left dorsiflexor is 3-4 minus over 5, the left EHL is through 5 and the left plantar flexor is 4+ over 5. Sensation is intact to pinprick. Reflexes are symmetrical, but diminished. Gait and stance both favor the left lower extremity due to the left foot drop.  Data Review:CBC    Component Value Date/Time   WBC 7.4 04/03/2015 0930   RBC 4.77 04/03/2015 0930   HGB 14.8 04/03/2015 0930   HCT 45.4 04/03/2015 0930   PLT 209 04/03/2015 0930   MCV 95.2 04/03/2015 0930   MCH 31.0 04/03/2015 0930   MCHC 32.6 04/03/2015 0930   RDW 12.9 04/03/2015 0930                          BMET    Component Value Date/Time   NA 141 04/03/2015 0930   K 4.2 04/03/2015 0930   CL 107 04/03/2015 0930   CO2 27 04/03/2015 0930   GLUCOSE 96 04/03/2015 0930   BUN 18 04/03/2015 0930   CREATININE 1.04 04/03/2015 0930   CALCIUM 9.8 04/03/2015 0930   GFRNONAA >60 04/03/2015 0930   GFRAA >60 04/03/2015 0930     Assessment/Plan: Patient with chronic difficulties with his low back involving multilevel multifactorial lumbar stenosis, but who presented over the past month and a half with an acute left lumbar radiculopathy with left footdrop secondary to a left L4-5 lumbar disc patient with a free fragment disc extending rostrally behind  the body of L4 on the left side. Patient admitted now for a left L4-5 lumbar laminotomy and micro-discectomy.  I've discussed with the patient the nature of his condition, the nature the surgical procedure, the typical length of surgery, hospital stay, and overall recuperation. We discussed limitations postoperatively. I discussed risks of surgery including risks of infection, bleeding, possibly need for transfusion, the risk of nerve root dysfunction with pain, weakness, numbness, or paresthesias, or risk of dural tear and CSF leakage and possible need for further surgery, the risk of recurrent disc herniation and the possible  need for further surgery, and the risk of anesthetic complications including myocardial infarction, stroke, pneumonia, and death. Understanding all this the patient does wish to proceed with surgery and is admitted for such.                  Hosie Spangle, MD 04/10/2015 11:45 AM

## 2015-04-10 NOTE — Progress Notes (Signed)
Care of pt assumed by MA Monchel Pollitt RN 

## 2015-04-10 NOTE — Anesthesia Postprocedure Evaluation (Signed)
Anesthesia Post Note  Patient: Alexander [name]  Procedure(s) Performed: Procedure(s) (LRB): LUMBAR LAMINECTOMY/DECOMPRESSION MICRODISCECTOMY 1 LEVEL (Left)  Patient location during evaluation: PACU Anesthesia Type: General Level of consciousness: awake and alert Pain management: pain level controlled Vital Signs Assessment: post-procedure vital signs reviewed and stable Respiratory status: spontaneous breathing and respiratory function stable Cardiovascular status: blood pressure returned to baseline and stable Postop Assessment: no signs of nausea or vomiting Anesthetic complications: no    Last Vitals:  Filed Vitals:   04/10/15 1407 04/10/15 1415  BP: 148/60 121/42  Pulse: 86 65  Temp: 36.3 C   Resp: 12 13    Last Pain:  Filed Vitals:   04/10/15 1423  PainSc: 0-No pain                 Ethal Gotay S

## 2015-04-10 NOTE — Anesthesia Procedure Notes (Signed)
Procedure Name: Intubation Date/Time: 04/10/2015 12:30 PM Performed by: Scheryl Darter Pre-anesthesia Checklist: Patient identified, Emergency Drugs available, Suction available, Patient being monitored and Timeout performed Patient Re-evaluated:Patient Re-evaluated prior to inductionOxygen Delivery Method: Circle system utilized Preoxygenation: Pre-oxygenation with 100% oxygen Intubation Type: IV induction Ventilation: Mask ventilation without difficulty Laryngoscope Size: Miller and 3 Grade View: Grade II Tube type: Oral Tube size: 7.5 mm Number of attempts: 1 Airway Equipment and Method: Stylet Placement Confirmation: ETT inserted through vocal cords under direct vision,  positive ETCO2 and breath sounds checked- equal and bilateral Secured at: 22 cm Tube secured with: Tape Dental Injury: Teeth and Oropharynx as per pre-operative assessment  Comments: Crowns intact

## 2015-04-11 ENCOUNTER — Encounter (HOSPITAL_COMMUNITY): Payer: Self-pay | Admitting: Neurosurgery

## 2015-04-11 DIAGNOSIS — M5116 Intervertebral disc disorders with radiculopathy, lumbar region: Secondary | ICD-10-CM | POA: Diagnosis not present

## 2015-04-11 MED ORDER — HYDROCODONE-ACETAMINOPHEN 5-325 MG PO TABS: 1.0000 | ORAL_TABLET | ORAL | Status: DC | PRN

## 2015-04-11 NOTE — Discharge Instructions (Signed)
Wound Care °Leave incision open to air. °You may shower. °Do not scrub directly on incision.  °Do not put any creams, lotions, or ointments on incision. °Activity °Walk each and every day, increasing distance each day. °No lifting greater than 5 lbs.  Avoid bending, arching, and twisting. °No driving for 2 weeks; may ride as a passenger locally. °If provided with back brace, wear when out of bed.  It is not necessary to wear in bed. °Diet °Resume your normal diet.  °Return to Work °Will be discussed at you follow up appointment. °Call Your Doctor If Any of These Occur °Redness, drainage, or swelling at the wound.  °Temperature greater than 101 degrees. °Severe pain not relieved by pain medication. °Incision starts to come apart. °Follow Up Appt °Call today for appointment in 3 weeks ([phone]) or for problems.  If you have any hardware placed in your spine, you will need an x-ray before your appointment. ° °Laminectomy °During a laminectomy, small pieces of bone in the spine called lamina are removed. The ligaments underneath the lamina and parts of the joints that have grown too large are also removed. This takes pressure off the nerves.  °LET YOUR HEALTH CARE PROVIDER KNOW ABOUT: °· Any allergies you have. °· All medicines you are taking, including vitamins, herbs, eye drops, creams, and over-the-counter medicines. °· Previous problems you or members of your family have had with the use of anesthetics. °· Any blood disorders you have. °· Previous surgeries you have had. °· Medical conditions you have. °RISKS AND COMPLICATIONS  °Generally, laminectomy is a safe procedure. However, as with any procedure, complications can occur. Possible complications include: °· Infection near the incision. °· Nerve damage. Signs of this can be pain, weakness, or numbness. °· Leaking of spinal fluid. °· Blood clot in a leg. The clot can move to the lungs. This can be very serious. °· Bowel or bladder incontinence (rare). °BEFORE  THE PROCEDURE  °· You will need to stop taking certain medicines as directed by your health care provider. °· If you smoke, stop at least 2 weeks before the procedure. Smoking can slow down the healing process and increase the risk of complications. °· Do not eat or drink anything for at least 8 hours before the procedure. Take any medicines that your health care provider tells you to keep taking with a sip of water. °· Do not drink alcohol the day before your surgery. °· Tell your health care provider if you develop a cold or any infection before your surgery. °· Arrange for someone to drive you home after the procedure or after your hospital stay. Also arrange for someone to help you with activities during recovery. °PROCEDURE °· Small monitors will be placed on your body. They are used to check your heart, blood pressure, and oxygen level. °· An IV tube will be inserted into one of your veins. Medicine will flow directly into your body through the IV tube. °· You might be given a sedative. This will help you relax. °· You will be given a medicine to make you sleep (general anesthetic), and a breathing tube will be placed into your lungs. During general anesthesia, you are unaware of the procedure and do not feel any pain. °· Your back will be cleaned with a special solution to kill germs on your skin. °· Once you are asleep, the surgeon will make a 2-inch to 5-inch cut (incision) in your back. The length of the incision will depend on how   many spinal bones (vertebrae) are being operated on. °· Muscles in the back will be moved away from the vertebrae and pulled to the side. °· Pieces of lamina will be removed. °· The ligament that lies under the lamina and connects your vertebrae will be removed. °· Enough ligaments and thickened joints will be removed to take pressure off your nerves. °· Your nerves will be identified, and their passage will be tracked and assessed for excessive tightness. °· Your back muscles  will be moved back into their normal position. °· The area under your skin will be closed with small, absorbable stitches. These stitches do not need to be removed. °· Your skin will be closed with small absorbable stitches or staples. °· A dressing will be put over your incision. °· The procedure may take 1-3 hours. °AFTER THE PROCEDURE  °· You will stay in a recovery area until the anesthesia has worn off. Your blood pressure and pulse will be checked every so often. Then you will be taken to a hospital room. °· You may continue to get fluids through the IV tube for a while. °· Some pain is normal. You may be given pain medicine while still in the recovery area. °· It is important to be up and moving as soon as possible after a surgery. Physical therapists will help you start walking. °· To prevent blood clots in your legs: °¨ You may be given special stockings to wear. °¨ You may need to take medicine to prevent clots. °· You may be asked to do special breathing exercises to re-expand your lungs. This is to prevent a lung infection. °· Most people stay in the hospital for 1-3 days after a laminectomy. °  °This information is not intended to replace advice given to you by your health care provider. Make sure you discuss any questions you have with your health care provider. °  °Document Released: 04/07/2009 Document Revised: 02/07/2013 Document Reviewed: 11/29/2012 °Elsevier Interactive Patient Education ©2016 [employer] Inc. ° °

## 2015-04-11 NOTE — Progress Notes (Signed)
Pt doing well. Pt and family given D/C instructions with Rx, verbal understanding was provided. Pt's incision is open to air with no sign of infection. Pt's IV was removed prior to D/C. Pt D/C'd home via wheelchair @ 1010 per MD order. Pt is stable @ D/C and has no other needs at this time. Holli Humbles, RN

## 2015-04-11 NOTE — Discharge Summary (Signed)
Physician Discharge Summary  Patient ID: GRIFFYN REINBOLD MRN: [id number] DOB/AGE: [birth year] 74 y.o.  Admit date: 04/10/2015 Discharge date: 04/11/2015  Admission Diagnoses:  Left L4-5 lumbar herniated disc, lumbar degenerative disease, lumbar spondylosis, lumbar radiculopathy, left foot drop  Discharge Diagnoses:  Left L4-5 lumbar herniated disc, lumbar degenerative disease, lumbar spondylosis, lumbar radiculopathy, left foot drop Active Problems:   HNP (herniated nucleus pulposus), lumbar   Discharged Condition: good  Hospital Course: She was admitted, underwent a left L4 hemilaminotomy and microdiscectomy. He is done well following surgery, little the way of pain, good relief of the radicular pain. He still has weakness of the left dorsiflexor and EHL. His wound is clean and dry. He is voiding well. We are discharging him to home with instructions regarding wound care and activities. He is scheduled to follow-up with me in about 3-1/2 weeks. He will let me know though if he has any difficulties between now and then.  Discharge Exam: Blood pressure 108/56, pulse 67, temperature 97.5 F (36.4 C), temperature source Oral, resp. rate 18, SpO2 94 %.  Disposition: 01-Home or Self Care     Medication List    TAKE these medications        aspirin 81 MG tablet  Take 81 mg by mouth daily.     calcium citrate 950 MG tablet  Commonly known as:  CALCITRATE - dosed in mg elemental calcium  Take 200 mg of elemental calcium by mouth daily.     cholecalciferol 1000 UNITS tablet  Commonly known as:  VITAMIN D  Take 1,000 Units by mouth daily.     famotidine 40 MG tablet  Commonly known as:  PEPCID  Take 40 mg by mouth daily.     Fish Oil 1000 MG Caps  Take 1,000 mg by mouth daily.     gabapentin 300 MG capsule  Commonly known as:  NEURONTIN  Take 300 mg by mouth 2 (two) times daily.     HYDROcodone-acetaminophen 5-325 MG tablet  Commonly known as:  NORCO/VICODIN  Take 1-2  tablets by mouth every 4 (four) hours as needed (mild pain).     metoprolol 50 MG tablet  Commonly known as:  LOPRESSOR  Take 25 mg by mouth 2 (two) times daily.     multivitamin with minerals Tabs tablet  Take 1 tablet by mouth daily.     naproxen 500 MG tablet  Commonly known as:  NAPROSYN  Take 500 mg by mouth 2 (two) times daily with a meal.     nitroGLYCERIN 0.4 MG SL tablet  Commonly known as:  NITROSTAT  Place 1 tablet (0.4 mg total) under the tongue every 5 (five) minutes as needed for chest pain.     rosuvastatin 40 MG tablet  Commonly known as:  CRESTOR  Take 40 mg by mouth daily.     vitamin C 250 MG tablet  Commonly known as:  ASCORBIC ACID  Take 250 mg by mouth daily.         SignedHosie Spangle 04/11/2015, 7:48 AM

## 2015-05-09 ENCOUNTER — Other Ambulatory Visit: Payer: Self-pay | Admitting: Cardiovascular Disease

## 2015-05-09 MED ORDER — NITROGLYCERIN 0.4 MG SL SUBL: 0.4000 mg | SUBLINGUAL_TABLET | SUBLINGUAL | Status: AC | PRN

## 2015-07-03 ENCOUNTER — Encounter: Payer: Self-pay | Admitting: Cardiovascular Disease

## 2015-07-03 ENCOUNTER — Ambulatory Visit (INDEPENDENT_AMBULATORY_CARE_PROVIDER_SITE_OTHER): Payer: Medicare Other | Admitting: Cardiovascular Disease

## 2015-07-03 VITALS — BP 120/74 | HR 68 | Ht 67.0 in | Wt 143.4 lb

## 2015-07-03 DIAGNOSIS — I2510 Atherosclerotic heart disease of native coronary artery without angina pectoris: Secondary | ICD-10-CM

## 2015-07-03 DIAGNOSIS — E785 Hyperlipidemia, unspecified: Secondary | ICD-10-CM | POA: Diagnosis not present

## 2015-07-03 LAB — COMPREHENSIVE METABOLIC PANEL
ALK PHOS: 41 U/L (ref 40–115)
ALT: 21 U/L (ref 9–46)
AST: 21 U/L (ref 10–35)
Albumin: 4.2 g/dL (ref 3.6–5.1)
BILIRUBIN TOTAL: 0.5 mg/dL (ref 0.2–1.2)
BUN: 33 mg/dL — AB (ref 7–25)
CALCIUM: 9.9 mg/dL (ref 8.6–10.3)
CO2: 23 mmol/L (ref 20–31)
CREATININE: 0.95 mg/dL (ref 0.70–1.18)
Chloride: 100 mmol/L (ref 98–110)
GLUCOSE: 99 mg/dL (ref 65–99)
Potassium: 4.7 mmol/L (ref 3.5–5.3)
SODIUM: 135 mmol/L (ref 135–146)
Total Protein: 6.7 g/dL (ref 6.1–8.1)

## 2015-07-03 LAB — LIPID PANEL
CHOL/HDL RATIO: 2.4 ratio (ref <=5.0)
CHOLESTEROL: 170 mg/dL (ref 125–200)
HDL: 72 mg/dL (ref >=40)
LDL Cholesterol: 66 mg/dL (ref <130)
Triglycerides: 162 mg/dL — ABNORMAL HIGH (ref <150)
VLDL: 32 mg/dL — AB (ref <30)

## 2015-07-03 NOTE — Progress Notes (Signed)
Alexander [name] Date of Birth  02-10-1941 [facility] [address]    [place], [place]  [address] [phone]  Fax  [phone]  Problem List 1. CAD- s/p stenting 2009 [name])  2. Hyperlipidemia 3. Peripheral Vascular  disease  4. Peripheral neuropathy     History of Present Illness:  Alexander [name] is a 75 year old gentleman with a history of coronary artery disease. Status post inferior wall myocardial infarction in 2009. He had a stent placed at that time. He is also status post cutting balloon procedure in 2009.  Has a history of hyperlipidemia.  He's done very well. He works out at [employer] on a regular basis. He's not had any episodes of chest pain or shortness of breath.  He still works out at [employer].   Sep 18, 2012:  Still working out at [employer].   He is having some memory problems and wants to decrease the dose of crestor.   Nov. 17, 2014:  Doing well, having some sinus problems.  No CP.   Still going to [facility] 5 days a week.   His most recent lipid profile had an LDL of 103 - he had tried to decrease his crestor to 10.  He is back on 20 mg a day.  October 05, 2013: Alexander [name] is a doing very well. He's a former patient of Dr. [name].   He goes to the [employer]   Jan. 7, 2016: Alexander [name] is a 75 yo with hx of CAD and hyperlipidemia. Alexander [name] is not doing as well today.  Is having some back pain.  He recently saw Dr. [name] at the [facility] .  He having some control issues with his right leg.    December 30, 2014:  Has some shortness of breath with exertion.  Very short of breath when he climbs 2 flights of stairs Has had some leg pain , wakes him up at night. Has known PVD   July 03, 2015 Alexander [name] had some shortness of breath .   Had an echo card gram revealed normal left ventricle systolic function. He did have mild diastolic dysfunction. Breathing is much better, not on any new meds.  Still goes to the [facility] and [facility] in [place] ( [place] of [place] pool) Had back  surgery Dec. 8, 2016  - had lost all strength in his left leg  Has neuropathy in both feet.   BP and HR look great today   Current Outpatient Prescriptions on File Prior to Visit  Medication Sig Dispense Refill  . aspirin 81 MG tablet Take 81 mg by mouth daily.    . calcium citrate (CALCITRATE - DOSED IN MG ELEMENTAL CALCIUM) 950 MG tablet Take 200 mg of elemental calcium by mouth daily.    . cholecalciferol (VITAMIN D) 1000 UNITS tablet Take 1,000 Units by mouth daily.    . famotidine (PEPCID) 40 MG tablet Take 40 mg by mouth daily.    [name] gabapentin (NEURONTIN) 300 MG capsule Take 300 mg by mouth 2 (two) times daily.    [name] HYDROcodone-acetaminophen (NORCO/VICODIN) 5-325 MG tablet Take 1-2 tablets by mouth every 4 (four) hours as needed (mild pain). 50 tablet 0  . metoprolol (LOPRESSOR) 50 MG tablet Take 25 mg by mouth 2 (two) times daily.    . Multiple Vitamin (MULTIVITAMIN WITH MINERALS) TABS tablet Take 1 tablet by mouth daily.    . naproxen (NAPROSYN) 500 MG tablet Take 500 mg by mouth 2 (two) times daily  with a meal.    . nitroGLYCERIN (NITROSTAT) 0.4 MG SL tablet Place 1 tablet (0.4 mg total) under the tongue every 5 (five) minutes as needed for chest pain. 25 tablet 1  . Omega-3 Fatty Acids (FISH OIL) 1000 MG CAPS Take 1,000 mg by mouth daily.    . rosuvastatin (CRESTOR) 40 MG tablet Take 40 mg by mouth daily.    . vitamin C (ASCORBIC ACID) 250 MG tablet Take 250 mg by mouth daily.     No current facility-administered medications on file prior to visit.    No Known Allergies  Past Medical History  Diagnosis Date  . Coronary artery disease     Stent to prox RCA 2009 with cutting balloon to PDA  . Acute inferior myocardial infarction [facility])      Stent to right coronary on Sep 06, 2007 with 3.5 x 20 mm Liberte non-drug eluting stent  . Hyperlipidemia   . History of tobacco abuse   . Dementia     memory problems  . GERD (gastroesophageal reflux disease)   . Arthritis   .  Neuromuscular disorder [facility])     Past Surgical History  Procedure Laterality Date  . Back surgery   February 2009  . Tonsillectomy    . Belpharoptosis repair    . Cardiac catheterization  2009  . Lumbar laminectomy/decompression microdiscectomy Left 04/10/2015    Procedure: LUMBAR LAMINECTOMY/DECOMPRESSION MICRODISCECTOMY 1 LEVEL;  Surgeon: Jovita Gamma, MD;  Location: [facility];  Service: Neurosurgery;  Laterality: Left;  Left L45 laminotomy    History  Smoking status  . Former Smoker  Smokeless tobacco  . Not on file    History  Alcohol Use  . Yes    Comment: social drinks wine    Family History  Problem Relation Age of Onset  . Heart attack Father 77  . Cancer Mother   . Cancer Brother     Reviw of Systems:  Reviewed in the HPI.  All other systems are negative.  Physical Exam: BP 120/74 mmHg  Pulse 68  Ht 5\' 7"  (1.702 m)  Wt 143 lb 6.4 oz (65.046 kg)  BMI 22.45 kg/m2 The patient is alert and oriented x 3.  The mood and affect are normal.   Skin: warm and dry.  Color is normal.    HEENT:   Home / AT. Normal carotids, no JVD  Lungs: Clear to auscultation   Heart: Regular S1-S2.    Abdomen: His abdomen is nontender. His good bowel sounds. Is no hepatosplenomegaly.  Extremities:  No clubbing cyanosis or edema.  Poor distal pulses in legs   Neuro:  The exam is nonfocal. His gait is normal.    EKG: Aug. 29, 2016:  Sinus rhythm at 72 with occasional PVCs   Assessment / Plan:   1. CAD- s/p stenting 2009 ( [facility])  No angina,  Does have some DOE - especially with climbing 2 flights of stairs. His most recent echo shows normal LV function  He needs to have extensive back surgery later this year. He is very stable and is at low risk for his back surgery     2. Hyperlipidemia - will get lipids today   3. Peripheral vascular disease:  Has an occluded left iliac artery . Moderate disease in right . Follow up with Dr. Fletcher Anon   4. Peripheral  neuropathy :  He should see his primary medical doctor   5.  Back pain  -  He'll need to  have more back surgery later this year. He's doing quite well from a cardiac standpoint and should be at low risk for back surgery later this year.  Nahser, Wonda Cheng, MD  07/03/2015 3:40 PM    [facility] [place],  [place], [place]  [address] Pager [phone] Phone: [phone]; Fax: [phone]   [facility]  [address] [place], [place]  [address] [phone]   Fax [phone]

## 2015-07-03 NOTE — Patient Instructions (Signed)
Medication Instructions:  Your physician recommends that you continue on your current medications as directed. Please refer to the Current Medication list given to you today.   Labwork: TODAY - cholesterol, complete metabolic panel   Testing/Procedures: None Ordered   Follow-Up: Your physician wants you to follow-up in: 6 months with Dr. Nahser.  You will receive a reminder letter in the mail two months in advance. If you don't receive a letter, please call our office to schedule the follow-up appointment.   If you need a refill on your cardiac medications before your next appointment, please call your pharmacy.   Thank you for choosing [facility]! Blakelyn Dinges, RN [phone]    

## 2015-12-25 ENCOUNTER — Other Ambulatory Visit: Payer: Self-pay | Admitting: *Deleted

## 2015-12-25 MED ORDER — METOPROLOL TARTRATE 50 MG PO TABS: 25.0000 mg | ORAL_TABLET | Freq: Two times a day (BID) | ORAL | 0 refills | Status: DC

## 2016-02-02 ENCOUNTER — Encounter (INDEPENDENT_AMBULATORY_CARE_PROVIDER_SITE_OTHER): Payer: Self-pay

## 2016-02-02 ENCOUNTER — Encounter: Payer: Self-pay | Admitting: Cardiovascular Disease

## 2016-02-02 ENCOUNTER — Ambulatory Visit (INDEPENDENT_AMBULATORY_CARE_PROVIDER_SITE_OTHER): Payer: Medicare Other | Admitting: Cardiovascular Disease

## 2016-02-02 VITALS — BP 124/66 | HR 63 | Ht 68.0 in | Wt 152.2 lb

## 2016-02-02 DIAGNOSIS — E785 Hyperlipidemia, unspecified: Secondary | ICD-10-CM | POA: Diagnosis not present

## 2016-02-02 DIAGNOSIS — I2510 Atherosclerotic heart disease of native coronary artery without angina pectoris: Secondary | ICD-10-CM

## 2016-02-02 NOTE — Progress Notes (Signed)
Alexander [name] Date of Birth  14-Sep-1940 Alexander [name] [address]    [place], [place]  [address] [phone]  Fax  [phone]  Problem List 1. CAD- s/p stenting 2009 Alexander [name])  2. Hyperlipidemia 3. Peripheral Vascular  disease  4. Peripheral neuropathy  5. alzheimers Disease   History of Present Illness:  Alexander [name] is a 75 year old gentleman with a history of coronary artery disease. Status post inferior wall myocardial infarction in 2009. He had a stent placed at that time. He is also status post cutting balloon procedure in 2009.  Has a history of hyperlipidemia.  He's done very well. He works out at [employer] on a regular basis. He's not had any episodes of chest pain or shortness of breath.  He still works out at [employer].   Sep 18, 2012:  Still working out at [employer].   He is having some memory problems and wants to decrease the dose of crestor.   Nov. 17, 2014:  Doing well, having some sinus problems.  No CP.   Still going to [facility] 5 days a week.   His most recent lipid profile had an LDL of 103 - he had tried to decrease his crestor to 10.  He is back on 20 mg a day.  October 05, 2013: Alexander [name] is a doing very well. He's a former patient of Dr. Doreatha [name].   He goes to the [employer]   Jan. 7, 2016: Alexander [name] is a 75 yo with hx of CAD and hyperlipidemia. Alexander [name] is not doing as well today.  Is having some back pain.  He recently saw Dr. Doreatha [name] at the [facility] .  He having some control issues with his right leg.    December 30, 2014:  Has some shortness of breath with exertion.  Very short of breath when he climbs 2 flights of stairs Has had some leg pain , wakes him up at night. Has known PVD   July 03, 2015  Alexander [name] had some shortness of breath .   Had an echo card gram revealed normal left ventricle systolic function. He did have mild diastolic dysfunction. Breathing is much better, not on any new meds.  Still goes to the [facility] and [place] ( [place] Had back surgery Dec. 8, 2016  - had lost all strength in his left leg  Has neuropathy in both feet.   BP and HR look great today   Oct. 2, 2017:  Was seen with his wife  Alexander [name]. No CP or dyspnea. Goes to the [facility] 5 days a week.   Was diagnosed with Alzheimers this past week,   Started Aricept last night    Current Outpatient Prescriptions on File Prior to Visit  Medication Sig Dispense Refill  . aspirin 81 MG tablet Take 81 mg by mouth daily.    . calcium citrate (CALCITRATE - DOSED IN MG ELEMENTAL CALCIUM) 950 MG tablet Take 200 mg of elemental calcium by mouth daily.    . cholecalciferol (VITAMIN D) 1000 UNITS tablet Take 1,000 Units by mouth daily.    . famotidine (PEPCID) 40 MG tablet Take 40 mg by mouth daily.    [name] gabapentin (NEURONTIN) 300 MG capsule Take 300 mg by mouth 2 (two) times daily.    [name] HYDROcodone-acetaminophen (NORCO/VICODIN) 5-325 MG tablet Take 1-2 tablets by mouth every 4 (four) hours as needed (mild pain). 50 tablet 0  . metoprolol (LOPRESSOR) 50 MG tablet  Take 0.5 tablets (25 mg total) by mouth 2 (two) times daily. 60 tablet 0  . Multiple Vitamin (MULTIVITAMIN WITH MINERALS) TABS tablet Take 1 tablet by mouth daily.    . naproxen (NAPROSYN) 500 MG tablet Take 500 mg by mouth 2 (two) times daily with a meal.    . nitroGLYCERIN (NITROSTAT) 0.4 MG SL tablet Place 1 tablet (0.4 mg total) under the tongue every 5 (five) minutes as needed for chest pain. 25 tablet 1  . Omega-3 Fatty Acids (FISH OIL) 1000 MG CAPS Take 1,000 mg by mouth daily.    . rosuvastatin (CRESTOR) 40 MG tablet Take 40 mg by mouth daily.    . vitamin C (ASCORBIC ACID) 250 MG tablet Take 250 mg by mouth daily.     No current facility-administered medications on file prior to visit.     No Known Allergies  Past Medical History:  Diagnosis Date  . Acute inferior myocardial infarction [facility])     Stent to right coronary on Sep 06, 2007 with 3.5 x 20 mm Liberte non-drug eluting stent  .  Arthritis   . Coronary artery disease    Stent to prox RCA 2009 with cutting balloon to PDA  . Dementia    memory problems  . GERD (gastroesophageal reflux disease)   . History of tobacco abuse   . Hyperlipidemia   . Neuromuscular disorder [facility])     Past Surgical History:  Procedure Laterality Date  . BACK SURGERY   February 2009  . BELPHAROPTOSIS REPAIR    . CARDIAC CATHETERIZATION  2009  . LUMBAR LAMINECTOMY/DECOMPRESSION MICRODISCECTOMY Left 04/10/2015   Procedure: LUMBAR LAMINECTOMY/DECOMPRESSION MICRODISCECTOMY 1 LEVEL;  Surgeon: Jovita Gamma, MD;  Location: [place] [facility];  Service: Neurosurgery;  Laterality: Left;  Left L45 laminotomy  . TONSILLECTOMY      History  Smoking Status  . Former Smoker  Smokeless Tobacco  . Never Used    History  Alcohol Use  . Yes    Comment: social drinks wine    Family History  Problem Relation Age of Onset  . Heart attack Father 100  . Cancer Mother   . Cancer Brother     Reviw of Systems:  Reviewed in the HPI.  All other systems are negative.  Physical Exam: BP 124/66   Pulse 63   Ht 5\' 8"  (1.727 m)   Wt 152 lb 3.2 oz (69 kg)   BMI 23.14 kg/m  The patient is alert and oriented x 3.  The mood and affect are normal.   Skin: warm and dry.  Color is normal.   HEENT:   [place] / AT. Normal carotids, no JVD Lungs: Clear to auscultation  Heart: Regular S1-S2.   Abdomen: His abdomen is nontender. His good bowel sounds. Is no hepatosplenomegaly. Extremities:  No clubbing cyanosis or edema.  Poor distal pulses in legs  Neuro:  The exam is nonfocal. His gait is normal.    EKG: Oct. 2, 2017:  NSR at 63.  No ST or T wave changes  Assessment / Plan:   1. CAD- s/p stenting 2009 ( [facility])  No angina,  Does have some DOE - especially with climbing 2 flights of stairs. His most recent echo shows normal LV function    2. Hyperlipidemia -   Will check labs in 6 months    3. Peripheral vascular disease:  Has an occluded left  iliac artery . Moderate disease in right . Follow up with Dr. Fletcher Anon  4. Peripheral neuropathy :  He should see his primary medical doctor   5.  Alzheimers Disease   Mertie Moores, MD  02/02/2016 9:24 AM    [place] [employer],  [place], [place]  [address] Pager [phone] Phone: [phone]; Fax: [phone]

## 2016-02-02 NOTE — Patient Instructions (Signed)

## 2016-02-28 ENCOUNTER — Other Ambulatory Visit: Payer: Self-pay | Admitting: Cardiovascular Disease

## 2016-05-05 DIAGNOSIS — J209 Acute bronchitis, unspecified: Secondary | ICD-10-CM | POA: Diagnosis not present

## 2016-05-13 DIAGNOSIS — L821 Other seborrheic keratosis: Secondary | ICD-10-CM | POA: Diagnosis not present

## 2016-05-13 DIAGNOSIS — L814 Other melanin hyperpigmentation: Secondary | ICD-10-CM | POA: Diagnosis not present

## 2016-05-13 DIAGNOSIS — D2272 Melanocytic nevi of left lower limb, including hip: Secondary | ICD-10-CM | POA: Diagnosis not present

## 2016-05-13 DIAGNOSIS — L820 Inflamed seborrheic keratosis: Secondary | ICD-10-CM | POA: Diagnosis not present

## 2016-05-13 DIAGNOSIS — D1801 Hemangioma of skin and subcutaneous tissue: Secondary | ICD-10-CM | POA: Diagnosis not present

## 2016-05-13 DIAGNOSIS — D225 Melanocytic nevi of trunk: Secondary | ICD-10-CM | POA: Diagnosis not present

## 2016-05-13 DIAGNOSIS — L570 Actinic keratosis: Secondary | ICD-10-CM | POA: Diagnosis not present

## 2016-05-13 DIAGNOSIS — D2271 Melanocytic nevi of right lower limb, including hip: Secondary | ICD-10-CM | POA: Diagnosis not present

## 2016-05-21 DIAGNOSIS — H5213 Myopia, bilateral: Secondary | ICD-10-CM | POA: Diagnosis not present

## 2016-05-21 DIAGNOSIS — H35373 Puckering of macula, bilateral: Secondary | ICD-10-CM | POA: Diagnosis not present

## 2016-05-21 DIAGNOSIS — H2513 Age-related nuclear cataract, bilateral: Secondary | ICD-10-CM | POA: Diagnosis not present

## 2016-06-15 DIAGNOSIS — M5136 Other intervertebral disc degeneration, lumbar region: Secondary | ICD-10-CM | POA: Diagnosis not present

## 2016-06-15 DIAGNOSIS — M4726 Other spondylosis with radiculopathy, lumbar region: Secondary | ICD-10-CM | POA: Diagnosis not present

## 2016-06-15 DIAGNOSIS — M4316 Spondylolisthesis, lumbar region: Secondary | ICD-10-CM | POA: Diagnosis not present

## 2016-06-15 DIAGNOSIS — M48062 Spinal stenosis, lumbar region with neurogenic claudication: Secondary | ICD-10-CM | POA: Diagnosis not present

## 2016-07-02 ENCOUNTER — Encounter: Payer: Self-pay | Admitting: Cardiovascular Disease

## 2016-07-12 ENCOUNTER — Ambulatory Visit: Payer: Self-pay | Admitting: Cardiovascular Disease

## 2016-07-26 ENCOUNTER — Encounter: Payer: Self-pay | Admitting: Cardiovascular Disease

## 2016-07-26 ENCOUNTER — Ambulatory Visit (INDEPENDENT_AMBULATORY_CARE_PROVIDER_SITE_OTHER): Payer: Medicare HMO | Admitting: Cardiovascular Disease

## 2016-07-26 VITALS — BP 120/80 | HR 56 | Ht 68.0 in | Wt 155.0 lb

## 2016-07-26 DIAGNOSIS — I2510 Atherosclerotic heart disease of native coronary artery without angina pectoris: Secondary | ICD-10-CM | POA: Diagnosis not present

## 2016-07-26 DIAGNOSIS — E782 Mixed hyperlipidemia: Secondary | ICD-10-CM

## 2016-07-26 DIAGNOSIS — I739 Peripheral vascular disease, unspecified: Secondary | ICD-10-CM

## 2016-07-26 NOTE — Patient Instructions (Signed)

## 2016-07-26 NOTE — Progress Notes (Signed)
Alexander [name] Date of Birth  June 16, 1940   [address]    [place], [place]  [address] [phone]  Fax  [phone]  Problem List 1. CAD- s/p stenting 2009 Alexander [name])  2. Hyperlipidemia 3. Peripheral Vascular  disease  4. Peripheral neuropathy  5. alzheimers Disease   History of Present Illness:  Alexander [name] is a 76 year old gentleman with a history of coronary artery disease. Status post inferior wall myocardial infarction in 2009. He had a stent placed at that time. He is also status post cutting balloon procedure in 2009.  Has a history of hyperlipidemia.  He's done very well. He works out at [employer] on a regular basis. He's not had any episodes of chest pain or shortness of breath.  He still works out at [employer].   Sep 18, 2012:  Still working out at [employer].   He is having some memory problems and wants to decrease the dose of crestor.   Nov. 17, 2014:  Doing well, having some sinus problems.  No CP.   Still going to [facility] 5 days a week.   His most recent lipid profile had an LDL of 103 - he had tried to decrease his crestor to 10.  He is back on 20 mg a day.  October 05, 2013: Alexander [name] is a doing very well. He's a former patient of Dr. Doreatha [name].   He goes to the [employer]   Jan. 7, 2016: Alexander [name] is a 76 yo with hx of CAD and hyperlipidemia. Alexander [name] is not doing as well today.  Is having some back pain.  He recently saw Dr. Doreatha [name] at the [facility] .  He having some control issues with his right leg.    December 30, 2014:  Has some shortness of breath with exertion.  Very short of breath when he climbs 2 flights of stairs Has had some leg pain , wakes him up at night. Has known PVD   July 03, 2015  Alexander [name] had some shortness of breath .   Had an echo card gram revealed normal left ventricle systolic function. He did have mild diastolic dysfunction. Breathing is much better, not on any new meds.  Still goes to the [facility] and [place] ( [place]) Had back  surgery Dec. 8, 2016  - had lost all strength in his left leg  Has neuropathy in both feet.   BP and HR look great today   Oct. 2, 2017:  Was seen with his wife  Alexander [name]. No CP or dyspnea. Goes to the [facility] 5 days a week.   Was diagnosed with Alzheimers this past week,   Started Aricept last night   July 26, 2016:  Doing well.   Still goes to the [facility]  5 times a week ( goes at 6 AM every day )  Was seen with his wife  Alexander [name]. No CP or dyspnea Recent labs at Dr. Rex [name] - [employer] [facility]     Current Outpatient Prescriptions on File Prior to Visit  Medication Sig Dispense Refill  . aspirin 81 MG tablet Take 81 mg by mouth daily.    . calcium citrate (CALCITRATE - DOSED IN MG ELEMENTAL CALCIUM) 950 MG tablet Take 200 mg of elemental calcium by mouth daily.    . cholecalciferol (VITAMIN D) 1000 UNITS tablet Take 1,000 Units by mouth daily.    [name] donepezil (ARICEPT) 5 MG tablet Take 5 mg by mouth daily.  0  . famotidine (PEPCID) 40 MG tablet Take 40 mg by mouth daily.    [name] HYDROcodone-acetaminophen (NORCO/VICODIN) 5-325 MG tablet Take 1-2 tablets by mouth every 4 (four) hours as needed (mild pain). 50 tablet 0  . metoprolol (LOPRESSOR) 50 MG tablet TAKE ONE-HALF TABLET BY MOUTH TWICE DAILY 30 tablet 11  . Multiple Vitamin (MULTIVITAMIN WITH MINERALS) TABS tablet Take 1 tablet by mouth daily.    . naproxen (NAPROSYN) 500 MG tablet Take 500 mg by mouth 2 (two) times daily with a meal.    . nitroGLYCERIN (NITROSTAT) 0.4 MG SL tablet Place 1 tablet (0.4 mg total) under the tongue every 5 (five) minutes as needed for chest pain. 25 tablet 1  . Omega-3 Fatty Acids (FISH OIL) 1000 MG CAPS Take 1,000 mg by mouth daily.    . rosuvastatin (CRESTOR) 40 MG tablet Take 40 mg by mouth daily.    . vitamin C (ASCORBIC ACID) 250 MG tablet Take 250 mg by mouth daily.     No current facility-administered medications on file prior to visit.     No Known Allergies  Past Medical History:  Diagnosis  Date  . Acute inferior myocardial infarction [facility])     Stent to right coronary on Sep 06, 2007 with 3.5 x 20 mm Liberte non-drug eluting stent  . Arthritis   . Coronary artery disease    Stent to prox RCA 2009 with cutting balloon to PDA  . Dementia    memory problems  . GERD (gastroesophageal reflux disease)   . History of tobacco abuse   . Hyperlipidemia   . Neuromuscular disorder [facility])     Past Surgical History:  Procedure Laterality Date  . BACK SURGERY   February 2009  . BELPHAROPTOSIS REPAIR    . CARDIAC CATHETERIZATION  2009  . LUMBAR LAMINECTOMY/DECOMPRESSION MICRODISCECTOMY Left 04/10/2015   Procedure: LUMBAR LAMINECTOMY/DECOMPRESSION MICRODISCECTOMY 1 LEVEL;  Surgeon: Alexander Gamma, MD;  Location: [place] [facility];  Service: Neurosurgery;  Laterality: Left;  Left L45 laminotomy  . TONSILLECTOMY      History  Smoking Status  . Former Smoker  Smokeless Tobacco  . Never Used    History  Alcohol Use  . Yes    Comment: social drinks wine    Family History  Problem Relation Age of Onset  . Heart attack Father 67  . Cancer Mother   . Cancer Brother     Reviw of Systems:  Reviewed in the HPI.  All other systems are negative.  Physical Exam: BP 120/80 (BP Location: Left Arm, Patient Position: Sitting, Cuff Size: Normal)   Pulse (!) 56   Ht 5\' 8"  (1.727 m)   Wt 155 lb (70.3 kg)   SpO2 92%   BMI 23.57 kg/m  The patient is alert and oriented x 3.  The mood and affect are normal.   Skin: warm and dry.  Color is normal.   HEENT:   [place] / AT. Normal carotids, no JVD Lungs: Clear to auscultation  Heart: Regular S1-S2.   Abdomen: His abdomen is nontender. His good bowel sounds. Is no hepatosplenomegaly. Extremities:  No clubbing cyanosis or edema.  Poor distal pulses in legs  Neuro:  The exam is nonfocal. His gait is normal.    EKG:   Assessment / Plan:   1. CAD- s/p stenting 2009 ( [facility])  No angina,  Does have some DOE - especially with climbing 2 flights  of stairs. His most recent echo shows normal LV function  2. Hyperlipidemia -   He has been getting his labs checked by his prirmary MD    3. Peripheral vascular disease:  Has an occluded left iliac artery . Moderate disease in right . Follow up with Dr. Fletcher Anon   4. Peripheral neuropathy :  He should see his primary medical doctor   5.  Alzheimers Disease   Mertie Moores, MD  07/26/2016 3:45 PM    [place],  [place], [place]  [address] Pager [phone] Phone: [phone]; Fax: [phone]

## 2016-09-29 DIAGNOSIS — I10 Essential (primary) hypertension: Secondary | ICD-10-CM | POA: Diagnosis not present

## 2016-09-29 DIAGNOSIS — E785 Hyperlipidemia, unspecified: Secondary | ICD-10-CM | POA: Diagnosis not present

## 2016-09-29 DIAGNOSIS — I2510 Atherosclerotic heart disease of native coronary artery without angina pectoris: Secondary | ICD-10-CM | POA: Diagnosis not present

## 2016-09-29 DIAGNOSIS — I252 Old myocardial infarction: Secondary | ICD-10-CM | POA: Diagnosis not present

## 2016-09-29 DIAGNOSIS — M25512 Pain in left shoulder: Secondary | ICD-10-CM | POA: Diagnosis not present

## 2016-09-29 DIAGNOSIS — R531 Weakness: Secondary | ICD-10-CM | POA: Diagnosis not present

## 2016-09-29 DIAGNOSIS — M546 Pain in thoracic spine: Secondary | ICD-10-CM | POA: Diagnosis not present

## 2016-09-29 DIAGNOSIS — R69 Illness, unspecified: Secondary | ICD-10-CM | POA: Diagnosis not present

## 2016-09-29 DIAGNOSIS — M549 Dorsalgia, unspecified: Secondary | ICD-10-CM | POA: Diagnosis not present

## 2016-09-29 DIAGNOSIS — M19012 Primary osteoarthritis, left shoulder: Secondary | ICD-10-CM | POA: Diagnosis not present

## 2016-09-29 DIAGNOSIS — R55 Syncope and collapse: Secondary | ICD-10-CM | POA: Diagnosis not present

## 2016-09-29 DIAGNOSIS — G8929 Other chronic pain: Secondary | ICD-10-CM | POA: Diagnosis not present

## 2016-10-05 DIAGNOSIS — M5030 Other cervical disc degeneration, unspecified cervical region: Secondary | ICD-10-CM | POA: Diagnosis not present

## 2016-10-05 DIAGNOSIS — M5412 Radiculopathy, cervical region: Secondary | ICD-10-CM | POA: Diagnosis not present

## 2016-10-05 DIAGNOSIS — M4722 Other spondylosis with radiculopathy, cervical region: Secondary | ICD-10-CM | POA: Diagnosis not present

## 2016-10-05 DIAGNOSIS — R29898 Other symptoms and signs involving the musculoskeletal system: Secondary | ICD-10-CM | POA: Diagnosis not present

## 2016-10-05 DIAGNOSIS — I10 Essential (primary) hypertension: Secondary | ICD-10-CM | POA: Diagnosis not present

## 2016-10-11 DIAGNOSIS — F0280 Dementia in other diseases classified elsewhere without behavioral disturbance: Secondary | ICD-10-CM | POA: Insufficient documentation

## 2016-10-11 DIAGNOSIS — G301 Alzheimer's disease with late onset: Secondary | ICD-10-CM | POA: Diagnosis not present

## 2016-10-11 DIAGNOSIS — R69 Illness, unspecified: Secondary | ICD-10-CM | POA: Diagnosis not present

## 2016-10-12 DIAGNOSIS — M542 Cervicalgia: Secondary | ICD-10-CM | POA: Diagnosis not present

## 2016-10-12 DIAGNOSIS — M4722 Other spondylosis with radiculopathy, cervical region: Secondary | ICD-10-CM | POA: Diagnosis not present

## 2016-11-01 DIAGNOSIS — M5412 Radiculopathy, cervical region: Secondary | ICD-10-CM | POA: Diagnosis not present

## 2016-11-01 DIAGNOSIS — M5030 Other cervical disc degeneration, unspecified cervical region: Secondary | ICD-10-CM | POA: Diagnosis not present

## 2016-11-01 DIAGNOSIS — M4722 Other spondylosis with radiculopathy, cervical region: Secondary | ICD-10-CM | POA: Diagnosis not present

## 2016-11-22 DIAGNOSIS — R69 Illness, unspecified: Secondary | ICD-10-CM | POA: Diagnosis not present

## 2016-12-21 DIAGNOSIS — M4316 Spondylolisthesis, lumbar region: Secondary | ICD-10-CM | POA: Diagnosis not present

## 2016-12-21 DIAGNOSIS — M48062 Spinal stenosis, lumbar region with neurogenic claudication: Secondary | ICD-10-CM | POA: Diagnosis not present

## 2016-12-21 DIAGNOSIS — M4722 Other spondylosis with radiculopathy, cervical region: Secondary | ICD-10-CM | POA: Diagnosis not present

## 2016-12-21 DIAGNOSIS — M5030 Other cervical disc degeneration, unspecified cervical region: Secondary | ICD-10-CM | POA: Diagnosis not present

## 2016-12-21 DIAGNOSIS — M5136 Other intervertebral disc degeneration, lumbar region: Secondary | ICD-10-CM | POA: Diagnosis not present

## 2016-12-21 DIAGNOSIS — M47816 Spondylosis without myelopathy or radiculopathy, lumbar region: Secondary | ICD-10-CM | POA: Diagnosis not present

## 2017-03-10 ENCOUNTER — Other Ambulatory Visit: Payer: Self-pay | Admitting: Cardiovascular Disease

## 2017-03-30 DIAGNOSIS — I10 Essential (primary) hypertension: Secondary | ICD-10-CM | POA: Diagnosis not present

## 2017-03-30 DIAGNOSIS — M5136 Other intervertebral disc degeneration, lumbar region: Secondary | ICD-10-CM | POA: Diagnosis not present

## 2017-03-30 DIAGNOSIS — E7800 Pure hypercholesterolemia, unspecified: Secondary | ICD-10-CM | POA: Diagnosis not present

## 2017-03-30 DIAGNOSIS — Z125 Encounter for screening for malignant neoplasm of prostate: Secondary | ICD-10-CM | POA: Diagnosis not present

## 2017-03-30 DIAGNOSIS — G301 Alzheimer's disease with late onset: Secondary | ICD-10-CM | POA: Diagnosis not present

## 2017-03-30 DIAGNOSIS — Z0000 Encounter for general adult medical examination without abnormal findings: Secondary | ICD-10-CM | POA: Diagnosis not present

## 2017-03-30 DIAGNOSIS — I739 Peripheral vascular disease, unspecified: Secondary | ICD-10-CM | POA: Diagnosis not present

## 2017-03-30 DIAGNOSIS — I2510 Atherosclerotic heart disease of native coronary artery without angina pectoris: Secondary | ICD-10-CM | POA: Diagnosis not present

## 2017-03-30 DIAGNOSIS — Z23 Encounter for immunization: Secondary | ICD-10-CM | POA: Diagnosis not present

## 2017-05-13 DIAGNOSIS — R69 Illness, unspecified: Secondary | ICD-10-CM | POA: Diagnosis not present

## 2017-05-13 DIAGNOSIS — G301 Alzheimer's disease with late onset: Secondary | ICD-10-CM | POA: Diagnosis not present

## 2017-05-17 ENCOUNTER — Encounter: Payer: Self-pay | Admitting: Cardiovascular Disease

## 2017-05-17 ENCOUNTER — Ambulatory Visit: Payer: Medicare HMO | Admitting: Cardiovascular Disease

## 2017-05-17 VITALS — BP 100/56 | HR 51 | Ht 66.0 in | Wt 152.1 lb

## 2017-05-17 DIAGNOSIS — I2510 Atherosclerotic heart disease of native coronary artery without angina pectoris: Secondary | ICD-10-CM | POA: Diagnosis not present

## 2017-05-17 DIAGNOSIS — E782 Mixed hyperlipidemia: Secondary | ICD-10-CM

## 2017-05-17 NOTE — Progress Notes (Signed)
Alexander [name] Date of Birth  Jun 23, 1940   [address]    [place], [place]  [address] [phone]  Fax  [phone]  Problem List 1. CAD- s/p stenting 2009 Alexander [name])  2. Hyperlipidemia 3. Peripheral Vascular  disease  4. Peripheral neuropathy  5. alzheimers Disease   History of Present Illness:  Alexander [name] is a 77 year old gentleman with a history of coronary artery disease. Status post inferior wall myocardial infarction in 2009. He had a stent placed at that time. He is also status post cutting balloon procedure in 2009.  Has a history of hyperlipidemia.  He's done very well. He works out at [employer] on a regular basis. He's not had any episodes of chest pain or shortness of breath.  He still works out at [employer].   Sep 18, 2012:  Still working out at [employer].   He is having some memory problems and wants to decrease the dose of crestor.   Nov. 17, 2014:  Doing well, having some sinus problems.  No CP.   Still going to [facility] 5 days a week.   His most recent lipid profile had an LDL of 103 - he had tried to decrease his crestor to 10.  He is back on 20 mg a day.  October 05, 2013: Alexander [name] is a doing very well. He's a former patient of Dr. Doreatha [name].   He goes to the [employer]   Jan. 7, 2016: Alexander [name] is a 77 yo with hx of CAD and hyperlipidemia. Alexander [name] is not doing as well today.  Is having some back pain.  He recently saw Dr. Doreatha [name] at the [facility] .  He having some control issues with his right leg.    December 30, 2014:  Has some shortness of breath with exertion.  Very short of breath when he climbs 2 flights of stairs Has had some leg pain , wakes him up at night. Has known PVD   July 03, 2015  Alexander [name] had some shortness of breath .   Had an echo card gram revealed normal left ventricle systolic function. He did have mild diastolic dysfunction. Breathing is much better, not on any new meds.  Still goes to the [facility] and walks [place] the [place] ( [place]) Had back  surgery Dec. 8, 2016  - had lost all strength in his left leg  Has neuropathy in both feet.   BP and HR look great today   Oct. 2, 2017:  Was seen with his wife  Alexander [name]. No CP or dyspnea. Goes to [facility] 5 days a week.   Was diagnosed with Alzheimers this past week,   Started Aricept last night   July 26, 2016:  Doing well.   Still goes to the [facility]  5 times a week ( goes at 6 AM every day )  Was seen with his wife  Alexander [name]. No CP or dyspnea Recent labs at Dr. Rex Kras - [employer] [facility]    Jan. 15, 2019:  doing well.   No CP , Was seen with his wife  Alexander [name]. Goes to the [facility] 5 days a week - 3 times a week for water aerobics and 2 days on the bike .  Dr. Rex Kras manages his chol.    Current Outpatient Medications on File Prior to Visit  Medication Sig Dispense Refill  . aspirin 81 MG tablet Take 81 mg by mouth daily.    . calcium citrate (CALCITRATE -  DOSED IN MG ELEMENTAL CALCIUM) 950 MG tablet Take 200 mg of elemental calcium by mouth daily.    . cholecalciferol (VITAMIN D) 1000 UNITS tablet Take 1,000 Units by mouth daily.    . Coenzyme Q10 (CO Q 10) 10 MG CAPS Take 1 capsule by mouth daily.    Alexander [name] donepezil (ARICEPT) 10 MG tablet Take 10 mg by mouth daily.    . famotidine (PEPCID) 40 MG tablet Take 40 mg by mouth daily.    Alexander [name] gabapentin (NEURONTIN) 600 MG tablet Take 600 mg by mouth 3 (three) times daily.    Alexander [name] MEGARED OMEGA-3 KRILL OIL PO Take 350 mg by mouth daily.    . memantine (NAMENDA) 5 MG tablet Take 5 mg by mouth 2 (two) times daily. IN 4 WEEKS DOSE CHANGE TO 10MG  BY MOUTH TWICE A DAY    . metoprolol tartrate (LOPRESSOR) 50 MG tablet TAKE 1/2 TABLET BY MOUTH TWICE A DAY 30 tablet 4  . Multiple Vitamin (MULTIVITAMIN WITH MINERALS) TABS tablet Take 1 tablet by mouth daily.    . naproxen (NAPROSYN) 500 MG tablet Take 500 mg by mouth 2 (two) times daily with a meal.    . naproxen sodium (ALEVE) 220 MG tablet Take 220 mg by mouth 2 (two) times daily.    . nitroGLYCERIN  (NITROSTAT) 0.4 MG SL tablet Place 1 tablet (0.4 mg total) under the tongue every 5 (five) minutes as needed for chest pain. 25 tablet 1  . Omega-3 Fatty Acids (FISH OIL) 1000 MG CAPS Take 1,000 mg by mouth daily.    . ranitidine (ZANTAC) 150 MG capsule Take 1 capsule by mouth daily.  0  . rosuvastatin (CRESTOR) 40 MG tablet Take 40 mg by mouth daily.    . vitamin C (ASCORBIC ACID) 250 MG tablet Take 250 mg by mouth daily.     No current facility-administered medications on file prior to visit.     No Known Allergies  Past Medical History:  Diagnosis Date  . Acute inferior myocardial infarction [facility])     Stent to right coronary on Sep 06, 2007 with 3.5 x 20 mm Liberte non-drug eluting stent  . Arthritis   . Coronary artery disease    Stent to prox RCA 2009 with cutting balloon to PDA  . Dementia    memory problems  . GERD (gastroesophageal reflux disease)   . History of tobacco abuse   . Hyperlipidemia   . Neuromuscular disorder [facility])     Past Surgical History:  Procedure Laterality Date  . BACK SURGERY   February 2009  . BELPHAROPTOSIS REPAIR    . CARDIAC CATHETERIZATION  2009  . LUMBAR LAMINECTOMY/DECOMPRESSION MICRODISCECTOMY Left 04/10/2015   Procedure: LUMBAR LAMINECTOMY/DECOMPRESSION MICRODISCECTOMY 1 LEVEL;  Surgeon: Alexander Gamma, MD;  Location: [place] [facility] ORS;  Service: Neurosurgery;  Laterality: Left;  Left L45 laminotomy  . TONSILLECTOMY      Social History   Tobacco Use  Smoking Status Former Smoker  Smokeless Tobacco Never Used    Social History   Substance and Sexual Activity  Alcohol Use Yes   Comment: social drinks wine    Family History  Problem Relation Age of Onset  . Heart attack Father 73  . Cancer Mother   . Cancer Brother     Reviw of Systems:  Noted in current history, all other systems are negative.  Physical Exam: Blood pressure (!) 100/56, pulse (!) 51, height 5\' 6"  (1.676 m), weight 152 lb 1.9 oz (69 kg), SpO2  95 %.  GEN:  Well  nourished, well developed in no acute distress HEENT: Normal NECK: No JVD; No carotid bruits LYMPHATICS: No lymphadenopathy CARDIAC: RR, RR  RESPIRATORY:  Clear to auscultation without rales, wheezing or rhonchi  ABDOMEN: Soft, non-tender, non-distended MUSCULOSKELETAL:  No edema; No deformity  SKIN: Warm and dry NEUROLOGIC:  Alert and oriented x 3    EKG:  May 17, 2017: Sinus bradycardia at 51.  Otherwise normal EKG.   Assessment / Plan:   1. CAD- s/p stenting 2009 ( [facility])  He seems to be doing very well. He denies any angina.  2. Hyperlipidemia -   Blood pressure stable.  Continue current medications   3. Peripheral vascular disease:  Has an occluded left iliac artery . Moderate disease in right .  no significant claudication .   We will follow up with Dr. Fletcher Anon   4. Peripheral neuropathy :    5.  Alzheimers Disease   Mertie Moores, MD  05/17/2017 11:38 AM    [facility] [place],  [place], [place]  [address] Pager [phone] Phone: [phone]; Fax: [phone]

## 2017-05-17 NOTE — Patient Instructions (Signed)
Medication Instructions:  Your physician recommends that you continue on your current medications as directed. Please refer to the Current Medication list given to you today.   Labwork: None Ordered   Testing/Procedures: None Ordered   Follow-Up: Your physician wants you to follow-up in: 6 months with Dr. Nahser.  You will receive a reminder letter in the mail two months in advance. If you don't receive a letter, please call our office to schedule the follow-up appointment.   If you need a refill on your cardiac medications before your next appointment, please call your pharmacy.   Thank you for choosing [facility]! Mona Ayars, RN [phone]    

## 2017-05-20 DIAGNOSIS — H35373 Puckering of macula, bilateral: Secondary | ICD-10-CM | POA: Diagnosis not present

## 2017-05-20 DIAGNOSIS — H2513 Age-related nuclear cataract, bilateral: Secondary | ICD-10-CM | POA: Diagnosis not present

## 2017-05-20 DIAGNOSIS — H5213 Myopia, bilateral: Secondary | ICD-10-CM | POA: Diagnosis not present

## 2017-05-25 DIAGNOSIS — L821 Other seborrheic keratosis: Secondary | ICD-10-CM | POA: Diagnosis not present

## 2017-05-25 DIAGNOSIS — L570 Actinic keratosis: Secondary | ICD-10-CM | POA: Diagnosis not present

## 2017-05-25 DIAGNOSIS — D2262 Melanocytic nevi of left upper limb, including shoulder: Secondary | ICD-10-CM | POA: Diagnosis not present

## 2017-05-25 DIAGNOSIS — L905 Scar conditions and fibrosis of skin: Secondary | ICD-10-CM | POA: Diagnosis not present

## 2017-05-25 DIAGNOSIS — D225 Melanocytic nevi of trunk: Secondary | ICD-10-CM | POA: Diagnosis not present

## 2017-05-25 DIAGNOSIS — D1801 Hemangioma of skin and subcutaneous tissue: Secondary | ICD-10-CM | POA: Diagnosis not present

## 2017-05-25 DIAGNOSIS — D2261 Melanocytic nevi of right upper limb, including shoulder: Secondary | ICD-10-CM | POA: Diagnosis not present

## 2017-06-24 ENCOUNTER — Other Ambulatory Visit: Payer: Self-pay | Admitting: Cardiovascular Disease

## 2017-06-24 MED ORDER — METOPROLOL TARTRATE 50 MG PO TABS: 25.0000 mg | ORAL_TABLET | Freq: Two times a day (BID) | ORAL | 3 refills | Status: DC

## 2017-08-24 ENCOUNTER — Other Ambulatory Visit: Payer: Self-pay | Admitting: Cardiovascular Disease

## 2017-08-24 NOTE — Telephone Encounter (Signed)
Outpatient Medication Detail    Disp Refills Start End   metoprolol tartrate (LOPRESSOR) 50 MG tablet 90 tablet 3 06/24/2017    Sig - Route: Take 0.5 tablets (25 mg total) by mouth 2 (two) times daily. - Oral   Sent to pharmacy as: metoprolol tartrate (LOPRESSOR) 50 MG tablet   E-Prescribing Status: Receipt confirmed by pharmacy (06/24/2017 2:46 PM EST)   Pharmacy   [id number] - [place], [place] [address]

## 2017-11-30 DIAGNOSIS — R69 Illness, unspecified: Secondary | ICD-10-CM | POA: Diagnosis not present

## 2017-12-08 DIAGNOSIS — G301 Alzheimer's disease with late onset: Secondary | ICD-10-CM | POA: Diagnosis not present

## 2017-12-08 DIAGNOSIS — R69 Illness, unspecified: Secondary | ICD-10-CM | POA: Diagnosis not present

## 2017-12-13 DIAGNOSIS — R69 Illness, unspecified: Secondary | ICD-10-CM | POA: Diagnosis not present

## 2017-12-17 ENCOUNTER — Other Ambulatory Visit: Payer: Self-pay | Admitting: Cardiovascular Disease

## 2017-12-31 ENCOUNTER — Other Ambulatory Visit: Payer: Self-pay | Admitting: Cardiovascular Disease

## 2018-01-19 ENCOUNTER — Encounter: Payer: Self-pay | Admitting: Cardiovascular Disease

## 2018-01-30 ENCOUNTER — Ambulatory Visit: Payer: Medicare HMO | Admitting: Cardiovascular Disease

## 2018-01-30 ENCOUNTER — Encounter

## 2018-01-30 ENCOUNTER — Encounter: Payer: Self-pay | Admitting: Cardiovascular Disease

## 2018-01-30 VITALS — BP 98/46 | HR 50 | Ht 66.0 in | Wt 154.0 lb

## 2018-01-30 DIAGNOSIS — I2510 Atherosclerotic heart disease of native coronary artery without angina pectoris: Secondary | ICD-10-CM

## 2018-01-30 DIAGNOSIS — S99929A Unspecified injury of unspecified foot, initial encounter: Secondary | ICD-10-CM | POA: Diagnosis not present

## 2018-01-30 MED ORDER — METOPROLOL TARTRATE 25 MG PO TABS: 12.5000 mg | ORAL_TABLET | Freq: Two times a day (BID) | ORAL | 3 refills | Status: DC

## 2018-01-30 NOTE — Patient Instructions (Signed)
Medication Instructions:  Your physician has recommended you make the following change in your medication:   DECREASE Metoprolol to 12.5 mg twice daily   Labwork: None Ordered   Testing/Procedures: None Ordered   Follow-Up: Your physician wants you to follow-up in: 6 months with Dr. Acie Fredrickson. You will receive a reminder letter in the mail two months in advance. If you don't receive a letter, please call our office to schedule the follow-up appointment.   If you need a refill on your cardiac medications before your next appointment, please call your pharmacy.   Thank you for choosing [facility]! Christen Bame, RN [phone]

## 2018-01-30 NOTE — Progress Notes (Signed)
Alexander [name] Date of Birth  1940/08/05   [address]    [place], [place]  [address] [phone]  Fax  [phone]  Problem List 1. CAD- s/p stenting 2009 Alexander [name])  2. Hyperlipidemia 3. Peripheral Vascular  disease  4. Peripheral neuropathy  5. alzheimers Disease    Alexander [name] is a 77 year old gentleman with a history of coronary artery disease. Status post inferior wall myocardial infarction in 2009. He had a stent placed at that time. He is also status post cutting balloon procedure in 2009.  Has a history of hyperlipidemia.  He's done very well. He works out at [employer] on a regular basis. He's not had any episodes of chest pain or shortness of breath.  He still works out at [employer].   Sep 18, 2012:  Still working out at [employer].   He is having some memory problems and wants to decrease the dose of crestor.   Nov. 17, 2014:  Doing well, having some sinus problems.  No CP.   Still going to [facility] 5 days a week.   His most recent lipid profile had an LDL of 103 - he had tried to decrease his crestor to 10.  He is back on 20 mg a day.  October 05, 2013: Alexander [name] is a doing very well. He's a former patient of Dr. Doreatha [name].   He goes to the [employer]   Jan. 7, 2016: Alexander [name] is a 77 yo with hx of CAD and hyperlipidemia. Alexander [name] is not doing as well today.  Is having some back pain.  He recently saw Dr. Doreatha [name] at the [facility] .  He having some control issues with his right leg.    December 30, 2014:  Has some shortness of breath with exertion.  Very short of breath when he climbs 2 flights of stairs Has had some leg pain , wakes him up at night. Has known PVD   July 03, 2015  Alexander [name] had some shortness of breath .   Had an echo card gram revealed normal left ventricle systolic function. He did have mild diastolic dysfunction. Breathing is much better, not on any new meds.  Still goes to the [facility] and [facility] [place] ( [place] of [place] pool) Had back surgery Dec. 8, 2016  - had  lost all strength in his left leg  Has neuropathy in both feet.   BP and HR look great today   Oct. 2, 2017:  Was seen with his wife  Alexander [name]. No CP or dyspnea. Goes to the [facility] 5 days a week.   Was diagnosed with Alzheimers this past week,   Started Aricept last night   July 26, 2016:  Doing well.   Still goes to the [facility]  5 times a week ( goes at 6 AM every day )  Was seen with his wife  Alexander [name]. No CP or dyspnea Recent labs at Dr. Rex [name] - [facility]    Jan. 15, 2019:  doing well.   No CP , Was seen with his wife  Alexander [name]. Goes to the [facility] 5 days a week - 3 times a week for water aerobics and 2 days on the bike .  Dr. Rex [name] manages his chol.   January 30, 2018: Alexander [name] is seen today for follow-up of his coronary artery disease. BP is a bit lower than usual . No syncope or presyncope  Does not eat much salt .  Still going to  the [employer] 5 day days a week  No CP or dyspnea   With working out.   BP is low,   Will reduce his metoprolol       Current Outpatient Medications on File Prior to Visit  Medication Sig Dispense Refill  . aspirin 81 MG tablet Take 81 mg by mouth daily.    . calcium citrate (CALCITRATE - DOSED IN MG ELEMENTAL CALCIUM) 950 MG tablet Take 200 mg of elemental calcium by mouth daily.    . cholecalciferol (VITAMIN D) 1000 UNITS tablet Take 1,000 Units by mouth daily.    . Coenzyme Q10 (CO Q 10) 10 MG CAPS Take 1 capsule by mouth daily.    [name] donepezil (ARICEPT) 10 MG tablet Take 10 mg by mouth daily.    . famotidine (PEPCID) 40 MG tablet Take 40 mg by mouth daily.    [name] gabapentin (NEURONTIN) 600 MG tablet Take 600 mg by mouth 3 (three) times daily.    . memantine (NAMENDA) 5 MG tablet Take 5 mg by mouth 2 (two) times daily.     . metoprolol tartrate (LOPRESSOR) 50 MG tablet TAKE 1/2 TABLETS (25 MG TOTAL) BY MOUTH 2 (TWO) TIMES DAILY. 90 tablet 0  . Multiple Vitamin (MULTIVITAMIN WITH MINERALS) TABS tablet Take 1 tablet by mouth daily.    .  naproxen sodium (ALEVE) 220 MG tablet Take 220 mg by mouth 2 (two) times daily.    . nitroGLYCERIN (NITROSTAT) 0.4 MG SL tablet Place 1 tablet (0.4 mg total) under the tongue every 5 (five) minutes as needed for chest pain. 25 tablet 1  . Omega-3 Fatty Acids (FISH OIL) 1000 MG CAPS Take 1,000 mg by mouth daily.    . ranitidine (ZANTAC) 150 MG capsule Take 1 capsule by mouth daily.  0  . rosuvastatin (CRESTOR) 40 MG tablet Take 40 mg by mouth daily.    . vitamin C (ASCORBIC ACID) 250 MG tablet Take 250 mg by mouth daily.     No current facility-administered medications on file prior to visit.     No Known Allergies  Past Medical History:  Diagnosis Date  . Acute inferior myocardial infarction [facility])     Stent to right coronary on Sep 06, 2007 with 3.5 x 20 mm Liberte non-drug eluting stent  . Arthritis   . Coronary artery disease    Stent to prox RCA 2009 with cutting balloon to PDA  . Dementia    memory problems  . GERD (gastroesophageal reflux disease)   . History of tobacco abuse   . Hyperlipidemia   . Neuromuscular disorder [facility])     Past Surgical History:  Procedure Laterality Date  . BACK SURGERY   February 2009  . BELPHAROPTOSIS REPAIR    . CARDIAC CATHETERIZATION  2009  . LUMBAR LAMINECTOMY/DECOMPRESSION MICRODISCECTOMY Left 04/10/2015   Procedure: LUMBAR LAMINECTOMY/DECOMPRESSION MICRODISCECTOMY 1 LEVEL;  Surgeon: Jovita Gamma, MD;  Location: [place] [facility];  Service: Neurosurgery;  Laterality: Left;  Left L45 laminotomy  . TONSILLECTOMY      Social History   Tobacco Use  Smoking Status Former Smoker  Smokeless Tobacco Never Used    Social History   Substance and Sexual Activity  Alcohol Use Yes   Comment: social drinks wine    Family History  Problem Relation Age of Onset  . Heart attack Father 4  . Cancer Mother   . Cancer Brother     Reviw of Systems:  Noted in current history, all other systems are  negative.  Physical Exam: Blood pressure (!)  98/46, pulse (!) 50, height 5\' 6"  (1.676 m), weight 154 lb (69.9 kg), SpO2 94 %.  GEN:  Elderly man.  NAD  HEENT: Normal NECK: No JVD; No carotid bruits LYMPHATICS: No lymphadenopathy CARDIAC: RR RESPIRATORY:  Clear to auscultation without rales, wheezing or rhonchi  ABDOMEN: Soft, non-tender, non-distended MUSCULOSKELETAL:  No edema; No deformity  SKIN: Warm and dry NEUROLOGIC:  Alert and oriented x 3   EKG:    Assessment / Plan:   1. CAD- s/p stenting 2009 ( [facility])   doing well.   2. Hyperlipidemia -    labs are managed by his primary MD   3. Peripheral vascular disease:  Has an occluded left iliac artery . Moderate disease in right .  continues to follow up with Dr. Fletcher Anon   4. Peripheral neuropathy :    5.  Alzheimers Disease   Mertie Moores, MD  01/30/2018 8:55 AM    [place],  [place], [place]  [address] Pager [phone] Phone: [phone]; Fax: [phone]

## 2018-01-31 ENCOUNTER — Ambulatory Visit: Payer: Medicare HMO | Admitting: Cardiovascular Disease

## 2018-02-08 DIAGNOSIS — T148XXA Other injury of unspecified body region, initial encounter: Secondary | ICD-10-CM | POA: Diagnosis not present

## 2018-03-10 DIAGNOSIS — M47816 Spondylosis without myelopathy or radiculopathy, lumbar region: Secondary | ICD-10-CM | POA: Diagnosis not present

## 2018-03-10 DIAGNOSIS — M48062 Spinal stenosis, lumbar region with neurogenic claudication: Secondary | ICD-10-CM | POA: Diagnosis not present

## 2018-03-10 DIAGNOSIS — M5136 Other intervertebral disc degeneration, lumbar region: Secondary | ICD-10-CM | POA: Diagnosis not present

## 2018-03-10 DIAGNOSIS — M4316 Spondylolisthesis, lumbar region: Secondary | ICD-10-CM | POA: Diagnosis not present

## 2018-04-11 DIAGNOSIS — Z125 Encounter for screening for malignant neoplasm of prostate: Secondary | ICD-10-CM | POA: Diagnosis not present

## 2018-04-11 DIAGNOSIS — I10 Essential (primary) hypertension: Secondary | ICD-10-CM | POA: Diagnosis not present

## 2018-04-12 DIAGNOSIS — E7800 Pure hypercholesterolemia, unspecified: Secondary | ICD-10-CM | POA: Diagnosis not present

## 2018-04-12 DIAGNOSIS — I10 Essential (primary) hypertension: Secondary | ICD-10-CM | POA: Diagnosis not present

## 2018-04-12 DIAGNOSIS — M5136 Other intervertebral disc degeneration, lumbar region: Secondary | ICD-10-CM | POA: Diagnosis not present

## 2018-04-12 DIAGNOSIS — Z23 Encounter for immunization: Secondary | ICD-10-CM | POA: Diagnosis not present

## 2018-04-12 DIAGNOSIS — I2510 Atherosclerotic heart disease of native coronary artery without angina pectoris: Secondary | ICD-10-CM | POA: Diagnosis not present

## 2018-04-12 DIAGNOSIS — Z1389 Encounter for screening for other disorder: Secondary | ICD-10-CM | POA: Diagnosis not present

## 2018-04-12 DIAGNOSIS — Z0000 Encounter for general adult medical examination without abnormal findings: Secondary | ICD-10-CM | POA: Diagnosis not present

## 2018-04-12 DIAGNOSIS — G301 Alzheimer's disease with late onset: Secondary | ICD-10-CM | POA: Diagnosis not present

## 2018-05-29 DIAGNOSIS — D692 Other nonthrombocytopenic purpura: Secondary | ICD-10-CM | POA: Diagnosis not present

## 2018-05-29 DIAGNOSIS — D2261 Melanocytic nevi of right upper limb, including shoulder: Secondary | ICD-10-CM | POA: Diagnosis not present

## 2018-05-29 DIAGNOSIS — L821 Other seborrheic keratosis: Secondary | ICD-10-CM | POA: Diagnosis not present

## 2018-05-29 DIAGNOSIS — L814 Other melanin hyperpigmentation: Secondary | ICD-10-CM | POA: Diagnosis not present

## 2018-05-29 DIAGNOSIS — D225 Melanocytic nevi of trunk: Secondary | ICD-10-CM | POA: Diagnosis not present

## 2018-05-29 DIAGNOSIS — L570 Actinic keratosis: Secondary | ICD-10-CM | POA: Diagnosis not present

## 2018-05-29 DIAGNOSIS — D2262 Melanocytic nevi of left upper limb, including shoulder: Secondary | ICD-10-CM | POA: Diagnosis not present

## 2018-05-31 DIAGNOSIS — H353131 Nonexudative age-related macular degeneration, bilateral, early dry stage: Secondary | ICD-10-CM | POA: Diagnosis not present

## 2018-05-31 DIAGNOSIS — H5213 Myopia, bilateral: Secondary | ICD-10-CM | POA: Diagnosis not present

## 2018-05-31 DIAGNOSIS — H2513 Age-related nuclear cataract, bilateral: Secondary | ICD-10-CM | POA: Diagnosis not present

## 2018-07-27 ENCOUNTER — Telehealth: Payer: Self-pay

## 2018-07-27 NOTE — Telephone Encounter (Signed)
Pt would like to have a phone visit for his appt.

## 2018-07-28 NOTE — Telephone Encounter (Signed)
   Primary Cardiologist:  Mertie Moores, MD   Patient contacted.  History reviewed.  No symptoms to suggest any unstable cardiac conditions.  Based on discussion, with current pandemic situation, we will be postponing this appointment for Alexander [name] with a plan for f/u in January 02, 2019 or sooner if feasible/necessary.  If symptoms change, he has been instructed to contact our office.   Patient's wife states they will call back with questions or concerns. She was thankful for the call.   Emmaline Life, RN  07/28/2018 11:56 AM         .

## 2018-08-01 ENCOUNTER — Ambulatory Visit: Payer: Medicare HMO | Admitting: Cardiovascular Disease

## 2018-11-06 DIAGNOSIS — T1512XA Foreign body in conjunctival sac, left eye, initial encounter: Secondary | ICD-10-CM | POA: Diagnosis not present

## 2018-11-20 DIAGNOSIS — R69 Illness, unspecified: Secondary | ICD-10-CM | POA: Diagnosis not present

## 2018-11-20 DIAGNOSIS — E7800 Pure hypercholesterolemia, unspecified: Secondary | ICD-10-CM | POA: Diagnosis not present

## 2018-11-20 DIAGNOSIS — I10 Essential (primary) hypertension: Secondary | ICD-10-CM | POA: Diagnosis not present

## 2018-11-20 DIAGNOSIS — G301 Alzheimer's disease with late onset: Secondary | ICD-10-CM | POA: Diagnosis not present

## 2018-11-20 DIAGNOSIS — I2510 Atherosclerotic heart disease of native coronary artery without angina pectoris: Secondary | ICD-10-CM | POA: Diagnosis not present

## 2018-11-29 DIAGNOSIS — H35373 Puckering of macula, bilateral: Secondary | ICD-10-CM | POA: Diagnosis not present

## 2018-11-29 DIAGNOSIS — H2513 Age-related nuclear cataract, bilateral: Secondary | ICD-10-CM | POA: Diagnosis not present

## 2018-12-14 DIAGNOSIS — Z79899 Other long term (current) drug therapy: Secondary | ICD-10-CM | POA: Diagnosis not present

## 2018-12-14 DIAGNOSIS — G301 Alzheimer's disease with late onset: Secondary | ICD-10-CM | POA: Diagnosis not present

## 2018-12-14 DIAGNOSIS — R69 Illness, unspecified: Secondary | ICD-10-CM | POA: Diagnosis not present

## 2018-12-19 DIAGNOSIS — M5126 Other intervertebral disc displacement, lumbar region: Secondary | ICD-10-CM | POA: Diagnosis not present

## 2018-12-19 DIAGNOSIS — M5412 Radiculopathy, cervical region: Secondary | ICD-10-CM | POA: Diagnosis not present

## 2018-12-19 DIAGNOSIS — M4722 Other spondylosis with radiculopathy, cervical region: Secondary | ICD-10-CM | POA: Diagnosis not present

## 2018-12-19 DIAGNOSIS — M5136 Other intervertebral disc degeneration, lumbar region: Secondary | ICD-10-CM | POA: Diagnosis not present

## 2018-12-19 DIAGNOSIS — M4316 Spondylolisthesis, lumbar region: Secondary | ICD-10-CM | POA: Diagnosis not present

## 2018-12-19 DIAGNOSIS — M48062 Spinal stenosis, lumbar region with neurogenic claudication: Secondary | ICD-10-CM | POA: Diagnosis not present

## 2018-12-19 DIAGNOSIS — M5030 Other cervical disc degeneration, unspecified cervical region: Secondary | ICD-10-CM | POA: Diagnosis not present

## 2018-12-19 DIAGNOSIS — M47816 Spondylosis without myelopathy or radiculopathy, lumbar region: Secondary | ICD-10-CM | POA: Diagnosis not present

## 2018-12-19 DIAGNOSIS — M4726 Other spondylosis with radiculopathy, lumbar region: Secondary | ICD-10-CM | POA: Diagnosis not present

## 2018-12-21 DIAGNOSIS — H2511 Age-related nuclear cataract, right eye: Secondary | ICD-10-CM | POA: Diagnosis not present

## 2018-12-21 DIAGNOSIS — H25811 Combined forms of age-related cataract, right eye: Secondary | ICD-10-CM | POA: Diagnosis not present

## 2018-12-28 DIAGNOSIS — H25812 Combined forms of age-related cataract, left eye: Secondary | ICD-10-CM | POA: Diagnosis not present

## 2018-12-28 DIAGNOSIS — H2512 Age-related nuclear cataract, left eye: Secondary | ICD-10-CM | POA: Diagnosis not present

## 2018-12-28 NOTE — Progress Notes (Signed)
Alexander [name] Date of Birth  1940/08/05   [address]    [place], [place]  [address] [phone]  Fax  [phone]  Problem List 1. CAD- s/p stenting 2009 Alexander [name])  2. Hyperlipidemia 3. Peripheral Vascular  disease  4. Peripheral neuropathy  5. alzheimers Disease    Alexander [name] is a 78 year old gentleman with a history of coronary artery disease. Status post inferior wall myocardial infarction in 2009. He had a stent placed at that time. He is also status post cutting balloon procedure in 2009.  Has a history of hyperlipidemia.  He's done very well. He works out at [employer] on a regular basis. He's not had any episodes of chest pain or shortness of breath.  He still works out at [employer].   Sep 18, 2012:  Still working out at [employer].   He is having some memory problems and wants to decrease the dose of crestor.   Nov. 17, 2014:  Doing well, having some sinus problems.  No CP.   Still going to [facility] 5 days a week.   His most recent lipid profile had an LDL of 103 - he had tried to decrease his crestor to 10.  He is back on 20 mg a day.  October 05, 2013: Alexander [name] is a doing very well. He's a former patient of Dr. Doreatha [name].   He goes to [facility] [employer]   Jan. 7, 2016: Alexander [name] is a 78 yo with hx of CAD and hyperlipidemia. Alexander [name] is not doing as well today.  Is having some back pain.  He recently saw Dr. Doreatha [name] at the [facility] .  He having some control issues with his right leg.    December 30, 2014:  Has some shortness of breath with exertion.  Very short of breath when he climbs 2 flights of stairs Has had some leg pain , wakes him up at night. Has known PVD   July 03, 2015  Alexander [name] had some shortness of breath .   Had an echo card gram revealed normal left ventricle systolic function. He did have mild diastolic dysfunction. Breathing is much better, not on any new meds.  Still goes to the [facility] and [facility] [place] ( [place]) Had back surgery Dec. 8, 2016  - had  lost all strength in his left leg  Has neuropathy in both feet.   BP and HR look great today   Oct. 2, 2017:  Was seen with his wife  Alexander [name]. No CP or dyspnea. Goes to the [facility] 5 days a week.   Was diagnosed with Alzheimers this past week,   Started Aricept last night   July 26, 2016:  Doing well.   Still goes to the [facility]  5 times a week ( goes at 6 AM every day )  Was seen with his wife  Alexander [name]. No CP or dyspnea Recent labs at Dr. Rex [name] - [facility]    Jan. 15, 2019:  doing well.   No CP , Was seen with his wife  Alexander [name]. Goes to the [facility] 5 days a week - 3 times a week for water aerobics and 2 days on the bike .  Dr. Rex [name] manages his chol.   January 30, 2018: Alexander [name] is seen today for follow-up of his coronary artery disease. BP is a bit lower than usual . No syncope or presyncope  Does not eat much salt .  Still going to  the [employer] 5 day days a week  No CP or dyspnea   With working out.   BP is low,   Will reduce his metoprolol      Aug. 28, 2020   Alexander [name] is seen today for follow up of his CAD , HLD, PVD. He also has some alzheimers disease Just restarted his water aerobics .     Current Outpatient Medications on File Prior to Visit  Medication Sig Dispense Refill  . aspirin 81 MG tablet Take 81 mg by mouth daily.    . calcium citrate (CALCITRATE - DOSED IN MG ELEMENTAL CALCIUM) 950 MG tablet Take 200 mg of elemental calcium by mouth daily.    . cholecalciferol (VITAMIN D) 1000 UNITS tablet Take 1,000 Units by mouth daily.    . Coenzyme Q10 (CO Q 10) 10 MG CAPS Take 1 capsule by mouth daily.    [name] donepezil (ARICEPT) 10 MG tablet Take 10 mg by mouth daily.    . famotidine (PEPCID) 40 MG tablet Take 20 mg by mouth daily.     [name] gabapentin (NEURONTIN) 600 MG tablet Take 600 mg by mouth 3 (three) times daily.    . memantine (NAMENDA) 5 MG tablet Take 5 mg by mouth 2 (two) times daily.     . metoprolol tartrate (LOPRESSOR) 25 MG tablet Take 0.5 tablets  (12.5 mg total) by mouth 2 (two) times daily. 90 tablet 3  . Multiple Vitamin (MULTIVITAMIN WITH MINERALS) TABS tablet Take 1 tablet by mouth daily.    . naproxen (NAPROSYN) 500 MG tablet As directed    . naproxen sodium (ALEVE) 220 MG tablet Take 220 mg by mouth 2 (two) times daily.    . nitroGLYCERIN (NITROSTAT) 0.4 MG SL tablet Place 1 tablet (0.4 mg total) under the tongue every 5 (five) minutes as needed for chest pain. 25 tablet 1  . Omega-3 Fatty Acids (FISH OIL) 1000 MG CAPS Take 1,000 mg by mouth daily.    . ranitidine (ZANTAC) 150 MG capsule Take 1 capsule by mouth daily.  0  . rosuvastatin (CRESTOR) 40 MG tablet Take 40 mg by mouth daily.     No current facility-administered medications on file prior to visit.     No Known Allergies  Past Medical History:  Diagnosis Date  . Acute inferior myocardial infarction [facility])     Stent to right coronary on Sep 06, 2007 with 3.5 x 20 mm Liberte non-drug eluting stent  . Arthritis   . Coronary artery disease    Stent to prox RCA 2009 with cutting balloon to PDA  . Dementia ([place])    memory problems  . GERD (gastroesophageal reflux disease)   . History of tobacco abuse   . Hyperlipidemia   . Neuromuscular disorder [facility])     Past Surgical History:  Procedure Laterality Date  . BACK SURGERY   February 2009  . BELPHAROPTOSIS REPAIR    . CARDIAC CATHETERIZATION  2009  . LUMBAR LAMINECTOMY/DECOMPRESSION MICRODISCECTOMY Left 04/10/2015   Procedure: LUMBAR LAMINECTOMY/DECOMPRESSION MICRODISCECTOMY 1 LEVEL;  Surgeon: Alexander Gamma, MD;  Location: [place] [facility];  Service: Neurosurgery;  Laterality: Left;  Left L45 laminotomy  . TONSILLECTOMY      Social History   Tobacco Use  Smoking Status Former Smoker  Smokeless Tobacco Never Used    Social History   Substance and Sexual Activity  Alcohol Use Yes   Comment: social drinks wine    Family History  Problem Relation Age of Onset  .  Heart attack Father 64  . Cancer Mother   .  Cancer Brother     Reviw of Systems:  Noted in current history, all other systems are negative.  Physical Exam: Blood pressure 112/60, pulse (!) 54, height 5\' 6"  (1.676 m), weight 153 lb 12.8 oz (69.8 kg), SpO2 97 %.  GEN:  Well nourished, well developed in no acute distress HEENT: Normal NECK: No JVD; No carotid bruits LYMPHATICS: No lymphadenopathy CARDIAC: RRR , no murmurs, rubs, gallops RESPIRATORY:  Clear to auscultation without rales, wheezing or rhonchi  ABDOMEN: Soft, non-tender, non-distended MUSCULOSKELETAL:  No edema; No deformity  SKIN: Warm and dry NEUROLOGIC:  Alert and oriented x 3   EKG:  Aug. 28 2020 sinus brady at 55.       Assessment / Plan:   1. CAD- s/p stenting 2009 ( [facility])  No angina , overall is doing well .   2. Hyperlipidemia -   Managed by primary .   No recent labs available but Dr. Rex [name] said his labs looked great   3. Peripheral vascular disease: no claudication   4. Peripheral neuropathy :    5.  Alzheimers Disease   Mertie Moores, MD  12/29/2018 3:27 PM    [facility] [place],  [place], [place]  [address] Pager [phone] Phone: [phone]; Fax: [phone]

## 2018-12-29 ENCOUNTER — Encounter: Payer: Self-pay | Admitting: Cardiovascular Disease

## 2018-12-29 ENCOUNTER — Ambulatory Visit (INDEPENDENT_AMBULATORY_CARE_PROVIDER_SITE_OTHER): Payer: Medicare HMO | Admitting: Cardiovascular Disease

## 2018-12-29 ENCOUNTER — Other Ambulatory Visit: Payer: Self-pay

## 2018-12-29 VITALS — BP 112/60 | HR 54 | Ht 66.0 in | Wt 153.8 lb

## 2018-12-29 DIAGNOSIS — E782 Mixed hyperlipidemia: Secondary | ICD-10-CM

## 2018-12-29 DIAGNOSIS — I2510 Atherosclerotic heart disease of native coronary artery without angina pectoris: Secondary | ICD-10-CM | POA: Diagnosis not present

## 2018-12-29 NOTE — Patient Instructions (Signed)
Your physician recommends that you continue on your current medications as directed. Please refer to the Current Medication list given to you today.   Your physician wants you to follow-up in: YEAR WITH DR NAHSER  You will receive a reminder letter in the mail two months in advance. If you don't receive a letter, please call our office to schedule the follow-up appointment.  

## 2019-01-02 ENCOUNTER — Ambulatory Visit: Payer: Medicare HMO | Admitting: Cardiovascular Disease

## 2019-01-21 ENCOUNTER — Other Ambulatory Visit: Payer: Self-pay | Admitting: Cardiovascular Disease

## 2019-02-06 DIAGNOSIS — H35372 Puckering of macula, left eye: Secondary | ICD-10-CM | POA: Diagnosis not present

## 2019-03-07 DIAGNOSIS — R69 Illness, unspecified: Secondary | ICD-10-CM | POA: Diagnosis not present

## 2019-05-09 DIAGNOSIS — I2510 Atherosclerotic heart disease of native coronary artery without angina pectoris: Secondary | ICD-10-CM | POA: Diagnosis not present

## 2019-05-09 DIAGNOSIS — Z23 Encounter for immunization: Secondary | ICD-10-CM | POA: Diagnosis not present

## 2019-05-09 DIAGNOSIS — I10 Essential (primary) hypertension: Secondary | ICD-10-CM | POA: Diagnosis not present

## 2019-05-09 DIAGNOSIS — I739 Peripheral vascular disease, unspecified: Secondary | ICD-10-CM | POA: Diagnosis not present

## 2019-05-09 DIAGNOSIS — K219 Gastro-esophageal reflux disease without esophagitis: Secondary | ICD-10-CM | POA: Diagnosis not present

## 2019-05-09 DIAGNOSIS — Z0000 Encounter for general adult medical examination without abnormal findings: Secondary | ICD-10-CM | POA: Diagnosis not present

## 2019-05-09 DIAGNOSIS — E7800 Pure hypercholesterolemia, unspecified: Secondary | ICD-10-CM | POA: Diagnosis not present

## 2019-05-09 DIAGNOSIS — G301 Alzheimer's disease with late onset: Secondary | ICD-10-CM | POA: Diagnosis not present

## 2019-05-09 DIAGNOSIS — M5136 Other intervertebral disc degeneration, lumbar region: Secondary | ICD-10-CM | POA: Diagnosis not present

## 2019-05-20 ENCOUNTER — Ambulatory Visit: Payer: Medicare Other | Attending: Internal Medicine

## 2019-05-20 DIAGNOSIS — Z23 Encounter for immunization: Secondary | ICD-10-CM | POA: Insufficient documentation

## 2019-05-20 NOTE — Progress Notes (Signed)
   Covid-19 Vaccination Clinic  Name:  UZAIR MANER    MRN: [id number] DOB: [birth year]  05/20/2019  Mr. Kleiber was observed post Covid-19 immunization for 15 minutes without incidence. He was provided with Vaccine Information Sheet and instruction to access the V-Safe system.   Mr. Piana was instructed to call 911 with any severe reactions post vaccine: [name] Difficulty breathing  . Swelling of your face and throat  . A fast heartbeat  . A bad rash all over your body  . Dizziness and weakness

## 2019-05-24 DIAGNOSIS — M5136 Other intervertebral disc degeneration, lumbar region: Secondary | ICD-10-CM | POA: Diagnosis not present

## 2019-05-29 DIAGNOSIS — I10 Essential (primary) hypertension: Secondary | ICD-10-CM | POA: Diagnosis not present

## 2019-05-29 DIAGNOSIS — M21372 Foot drop, left foot: Secondary | ICD-10-CM | POA: Diagnosis not present

## 2019-05-29 DIAGNOSIS — M48062 Spinal stenosis, lumbar region with neurogenic claudication: Secondary | ICD-10-CM | POA: Diagnosis not present

## 2019-05-29 DIAGNOSIS — M5416 Radiculopathy, lumbar region: Secondary | ICD-10-CM | POA: Diagnosis not present

## 2019-05-29 DIAGNOSIS — M5126 Other intervertebral disc displacement, lumbar region: Secondary | ICD-10-CM | POA: Diagnosis not present

## 2019-05-29 DIAGNOSIS — Z6826 Body mass index (BMI) 26.0-26.9, adult: Secondary | ICD-10-CM | POA: Diagnosis not present

## 2019-05-29 DIAGNOSIS — Z9889 Other specified postprocedural states: Secondary | ICD-10-CM | POA: Diagnosis not present

## 2019-05-29 DIAGNOSIS — M47816 Spondylosis without myelopathy or radiculopathy, lumbar region: Secondary | ICD-10-CM | POA: Diagnosis not present

## 2019-05-29 DIAGNOSIS — M4316 Spondylolisthesis, lumbar region: Secondary | ICD-10-CM | POA: Diagnosis not present

## 2019-05-29 DIAGNOSIS — M5136 Other intervertebral disc degeneration, lumbar region: Secondary | ICD-10-CM | POA: Diagnosis not present

## 2019-05-31 DIAGNOSIS — M5136 Other intervertebral disc degeneration, lumbar region: Secondary | ICD-10-CM | POA: Diagnosis not present

## 2019-05-31 DIAGNOSIS — M5416 Radiculopathy, lumbar region: Secondary | ICD-10-CM | POA: Diagnosis not present

## 2019-05-31 DIAGNOSIS — M4310 Spondylolisthesis, site unspecified: Secondary | ICD-10-CM | POA: Diagnosis not present

## 2019-05-31 DIAGNOSIS — I10 Essential (primary) hypertension: Secondary | ICD-10-CM | POA: Diagnosis not present

## 2019-05-31 DIAGNOSIS — M5127 Other intervertebral disc displacement, lumbosacral region: Secondary | ICD-10-CM | POA: Diagnosis not present

## 2019-05-31 DIAGNOSIS — M47816 Spondylosis without myelopathy or radiculopathy, lumbar region: Secondary | ICD-10-CM | POA: Diagnosis not present

## 2019-05-31 DIAGNOSIS — M48061 Spinal stenosis, lumbar region without neurogenic claudication: Secondary | ICD-10-CM | POA: Diagnosis not present

## 2019-05-31 DIAGNOSIS — M48062 Spinal stenosis, lumbar region with neurogenic claudication: Secondary | ICD-10-CM | POA: Diagnosis not present

## 2019-05-31 DIAGNOSIS — Z6826 Body mass index (BMI) 26.0-26.9, adult: Secondary | ICD-10-CM | POA: Diagnosis not present

## 2019-06-01 DIAGNOSIS — M5416 Radiculopathy, lumbar region: Secondary | ICD-10-CM | POA: Diagnosis not present

## 2019-06-01 DIAGNOSIS — M25552 Pain in left hip: Secondary | ICD-10-CM | POA: Diagnosis not present

## 2019-06-04 DIAGNOSIS — M4726 Other spondylosis with radiculopathy, lumbar region: Secondary | ICD-10-CM | POA: Diagnosis not present

## 2019-06-04 DIAGNOSIS — M5136 Other intervertebral disc degeneration, lumbar region: Secondary | ICD-10-CM | POA: Diagnosis not present

## 2019-06-04 DIAGNOSIS — M48061 Spinal stenosis, lumbar region without neurogenic claudication: Secondary | ICD-10-CM | POA: Diagnosis not present

## 2019-06-07 ENCOUNTER — Ambulatory Visit: Payer: Medicare HMO | Attending: Internal Medicine

## 2019-06-07 DIAGNOSIS — Z23 Encounter for immunization: Secondary | ICD-10-CM | POA: Insufficient documentation

## 2019-06-07 NOTE — Progress Notes (Signed)
   Covid-19 Vaccination Clinic  Name:  Alexander [name]    MRN: [id number] DOB: [birth year]  06/07/2019  [name] was observed post Covid-19 immunization for 15 minutes without incidence. He was provided with Vaccine Information Sheet and instruction to access the V-Safe system.   Mr. Corona was instructed to call 911 with any severe reactions post vaccine: [name] Difficulty breathing  . Swelling of your face and throat  . A fast heartbeat  . A bad rash all over your body  . Dizziness and weakness    Immunizations Administered    Name Date Dose VIS Date Route   Pfizer COVID-19 Vaccine 06/07/2019 11:05 AM 0.3 mL 04/13/2019 Intramuscular   Manufacturer: [place]   Lot: [id number]   [place]: [id number]

## 2019-07-02 DIAGNOSIS — M47816 Spondylosis without myelopathy or radiculopathy, lumbar region: Secondary | ICD-10-CM | POA: Diagnosis not present

## 2019-07-02 DIAGNOSIS — M48062 Spinal stenosis, lumbar region with neurogenic claudication: Secondary | ICD-10-CM | POA: Diagnosis not present

## 2019-07-02 DIAGNOSIS — M5136 Other intervertebral disc degeneration, lumbar region: Secondary | ICD-10-CM | POA: Diagnosis not present

## 2019-08-06 ENCOUNTER — Encounter (HOSPITAL_BASED_OUTPATIENT_CLINIC_OR_DEPARTMENT_OTHER): Payer: Self-pay | Admitting: *Deleted

## 2019-08-06 ENCOUNTER — Emergency Department (HOSPITAL_BASED_OUTPATIENT_CLINIC_OR_DEPARTMENT_OTHER): Payer: Medicare HMO

## 2019-08-06 ENCOUNTER — Emergency Department (HOSPITAL_BASED_OUTPATIENT_CLINIC_OR_DEPARTMENT_OTHER)
Admission: EM | Admit: 2019-08-06 | Discharge: 2019-08-06 | Disposition: A | Discharge status: Home / Self Care | Payer: Medicare HMO | Attending: Emergency Medicine | Admitting: Emergency Medicine

## 2019-08-06 ENCOUNTER — Other Ambulatory Visit: Payer: Self-pay

## 2019-08-06 DIAGNOSIS — F0280 Dementia in other diseases classified elsewhere without behavioral disturbance: Secondary | ICD-10-CM | POA: Diagnosis not present

## 2019-08-06 DIAGNOSIS — Z79899 Other long term (current) drug therapy: Secondary | ICD-10-CM | POA: Insufficient documentation

## 2019-08-06 DIAGNOSIS — Z7982 Long term (current) use of aspirin: Secondary | ICD-10-CM | POA: Insufficient documentation

## 2019-08-06 DIAGNOSIS — R200 Anesthesia of skin: Secondary | ICD-10-CM | POA: Diagnosis not present

## 2019-08-06 DIAGNOSIS — R202 Paresthesia of skin: Secondary | ICD-10-CM | POA: Insufficient documentation

## 2019-08-06 DIAGNOSIS — Z87891 Personal history of nicotine dependence: Secondary | ICD-10-CM | POA: Diagnosis not present

## 2019-08-06 DIAGNOSIS — R69 Illness, unspecified: Secondary | ICD-10-CM | POA: Diagnosis not present

## 2019-08-06 DIAGNOSIS — G309 Alzheimer's disease, unspecified: Secondary | ICD-10-CM | POA: Diagnosis not present

## 2019-08-06 HISTORY — DX: Dementia in other diseases classified elsewhere, unspecified severity, without behavioral disturbance, psychotic disturbance, mood disturbance, and anxiety: F02.80

## 2019-08-06 LAB — CBC WITH DIFFERENTIAL/PLATELET
Abs Immature Granulocytes: 0.01 10*3/uL (ref 0.00–0.07)
Basophils Absolute: 0 10*3/uL (ref 0.0–0.1)
Basophils Relative: 0 %
Eosinophils Absolute: 0.1 10*3/uL (ref 0.0–0.5)
Eosinophils Relative: 2 %
HCT: 46 % (ref 39.0–52.0)
Hemoglobin: 15.1 g/dL (ref 13.0–17.0)
Immature Granulocytes: 0 %
Lymphocytes Relative: 31 %
Lymphs Abs: 1.9 10*3/uL (ref 0.7–4.0)
MCH: 31.9 pg (ref 26.0–34.0)
MCHC: 32.8 g/dL (ref 30.0–36.0)
MCV: 97 fL (ref 80.0–100.0)
Monocytes Absolute: 0.8 10*3/uL (ref 0.1–1.0)
Monocytes Relative: 13 %
Neutro Abs: 3.2 10*3/uL (ref 1.7–7.7)
Neutrophils Relative %: 54 %
Platelets: 188 10*3/uL (ref 150–400)
RBC: 4.74 MIL/uL (ref 4.22–5.81)
RDW: 13.5 % (ref 11.5–15.5)
WBC: 6 10*3/uL (ref 4.0–10.5)
nRBC: 0 % (ref 0.0–0.2)

## 2019-08-06 LAB — BASIC METABOLIC PANEL
Anion gap: 10 (ref 5–15)
BUN: 17 mg/dL (ref 8–23)
CO2: 27 mmol/L (ref 22–32)
Calcium: 9.3 mg/dL (ref 8.9–10.3)
Chloride: 105 mmol/L (ref 98–111)
Creatinine, Ser: 1.07 mg/dL (ref 0.61–1.24)
GFR calc Af Amer: >60 mL/min (ref >60)
GFR calc non Af Amer: >60 mL/min (ref >60)
Glucose, Bld: 96 mg/dL (ref 70–99)
Potassium: 4.3 mmol/L (ref 3.5–5.1)
Sodium: 142 mmol/L (ref 135–145)

## 2019-08-06 NOTE — ED Notes (Signed)
ED Provider at bedside. 

## 2019-08-06 NOTE — ED Triage Notes (Signed)
Left hand numbness when he woke this am. He was last seen normal 10:30pm. He walks with a cane due to neuropathy and back surgery several years ago. 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 Hx of alzheimers.

## 2019-08-06 NOTE — ED Provider Notes (Signed)
[facility] Provider Note  CSN: [id number] Arrival date & time: 08/06/19 1110    History Chief Complaint  Patient presents with  . Numbness    HPI  Alexander [name] is a 79 y.o. male presents to the emergency department for evaluation of left hand numbness.  The patient has history of Alzheimer's and so his wife helps with the history.  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  She states that she noted that he was flexing his left hand this morning and she asked him what was wrong he told her that his hand was numb.  He did not complain of this yesterday although she is not entirely certain of his time of onset.  Patient denies any weakness, no neck pain, no chest pain shortness of breath or palpitations.  He is otherwise been at his baseline recently.  Patient describes the numbness as a tingling, similar to his foot neuropathy.   Past Medical History:  Diagnosis Date  . Acute inferior myocardial infarction [facility])     Stent to right coronary on Sep 06, 2007 with 3.5 x 20 mm Liberte non-drug eluting stent  . Alzheimer disease ([place])   . Arthritis   . Coronary artery disease    Stent to prox RCA 2009 with cutting balloon to PDA  . Dementia ([place])    memory problems  . GERD (gastroesophageal reflux disease)   . History of tobacco abuse   . Hyperlipidemia   . Neuromuscular disorder [facility])     Past Surgical History:  Procedure Laterality Date  . BACK SURGERY   February 2009  . BELPHAROPTOSIS REPAIR    . CARDIAC CATHETERIZATION  2009  . LUMBAR LAMINECTOMY/DECOMPRESSION MICRODISCECTOMY Left 04/10/2015   Procedure: LUMBAR LAMINECTOMY/DECOMPRESSION MICRODISCECTOMY 1 LEVEL;  Surgeon: Jovita Gamma, MD;  Location: [place] [facility];  Service: Neurosurgery;  Laterality: Left;  Left L45 laminotomy  . TONSILLECTOMY      Family History  Problem Relation Age of Onset  . Heart  attack Father 11  . Cancer Mother   . Cancer Brother     Social History   Tobacco Use  . Smoking status: Former Research scientist (life sciences)  . Smokeless tobacco: Never Used  Substance Use Topics  . Alcohol use: Yes    Comment: social drinks wine  . Drug use: No     Home Medications Prior to Admission medications   Medication Sig Start Date End Date Taking? Authorizing Provider  aspirin 81 MG tablet Take 81 mg by mouth daily.   Yes [provider]  Coenzyme Q10 (CO Q 10) 10 MG CAPS Take 1 capsule by mouth daily.   Yes [provider]  donepezil (ARICEPT) 10 MG tablet Take 10 mg by mouth daily.   Yes [provider]  famotidine (PEPCID) 40 MG tablet Take 20 mg by mouth daily.    Yes [provider]  gabapentin (NEURONTIN) 600 MG tablet Take 600 mg by mouth 3 (three) times daily. 06/30/16  Yes [provider]  memantine (NAMENDA) 5 MG tablet Take 10 mg by mouth 2 (two) times daily.    Yes [provider]  metoprolol tartrate (LOPRESSOR) 25 MG tablet TAKE 0.5 TABLETS (12.5 MG TOTAL) BY MOUTH 2 (TWO) TIMES DAILY. Patient taking differently: Take 25 mg by mouth 2 (two) times daily.  01/23/19  Yes Nahser, Wonda Cheng, MD  naproxen (NAPROSYN) 500 MG tablet As directed  10/27/18  Yes [provider]  naproxen sodium (ALEVE) 220 MG tablet Take 220 mg by mouth 2 (two) times daily.   Yes [provider]  nitroGLYCERIN (NITROSTAT) 0.4 MG SL tablet Place 1 tablet (0.4 mg total) under the tongue every 5 (five) minutes as needed for chest pain. 05/09/15  Yes Nahser, Wonda Cheng, MD  Omega-3 Fatty Acids (FISH OIL) 1000 MG CAPS Take 1,000 mg by mouth daily.   Yes [provider]  rosuvastatin (CRESTOR) 40 MG tablet Take 40 mg by mouth daily.   Yes [provider]  calcium citrate (CALCITRATE - DOSED IN MG ELEMENTAL CALCIUM) 950 MG tablet Take 200 mg of elemental calcium by mouth daily.    [provider]  cholecalciferol (VITAMIN D)  1000 UNITS tablet Take 1,000 Units by mouth daily.    [provider]  Multiple Vitamin (MULTIVITAMIN WITH MINERALS) TABS tablet Take 1 tablet by mouth daily.    [provider]  ranitidine (ZANTAC) 150 MG capsule Take 1 capsule by mouth daily. 02/14/17   [provider]     Allergies    Patient has no known allergies.   Review of Systems   Review of Systems  Constitutional: Negative for fever.  HENT: Negative for congestion and sore throat.   Respiratory: Negative for cough and shortness of breath.   Cardiovascular: Negative for chest pain.  Gastrointestinal: Negative for abdominal pain, diarrhea, nausea and vomiting.  Genitourinary: Negative for dysuria.  Musculoskeletal: Negative for myalgias.  Skin: Negative for rash.  Neurological: Positive for numbness. Negative for weakness and headaches.  Psychiatric/Behavioral: Negative for behavioral problems.     Physical Exam BP 136/62 (BP Location: Left Arm)   Pulse (!) 56   Temp 98 F (36.7 C) (Oral)   Resp 15   Ht 5\' 8"  (1.727 m)   Wt 69.4 kg   SpO2 92%   BMI 23.26 kg/m   Physical Exam Constitutional:      Appearance: Normal appearance.  HENT:     Head: Normocephalic and atraumatic.     Nose: Nose normal.     Mouth/Throat:     Mouth: Mucous membranes are moist.  Eyes:     Extraocular Movements: Extraocular movements intact.     Conjunctiva/sclera: Conjunctivae normal.  Cardiovascular:     Rate and Rhythm: Normal rate.  Pulmonary:     Effort: Pulmonary effort is normal.     Breath sounds: Normal breath sounds.  Abdominal:     General: Abdomen is flat.     Palpations: Abdomen is soft.     Tenderness: There is no abdominal tenderness.  Musculoskeletal:        General: No swelling. Normal range of motion.     Cervical back: Neck supple.  Skin:    General: Skin is warm and dry.  Neurological:     General: No focal deficit present.     Mental Status: He is alert.     Cranial Nerves:  No cranial nerve deficit.     Sensory: Sensory deficit (Subjective decreased sensation of the left hand over the palmar surface of all 5 fingers and the palm, no tingling proximally or over the dorsal hand.) present.     Motor: Weakness (Left foot drop, normal strength in upper extremities) present.     Coordination: Coordination normal.  Psychiatric:        Mood and Affect: Mood normal.      ED Results / Procedures / Treatments   Labs (  all labs ordered are listed, but only abnormal results are displayed) Labs Reviewed  BASIC METABOLIC PANEL  CBC WITH DIFFERENTIAL/PLATELET    EKG EKG Interpretation  Date/Time:  Monday August 06 2019 11:52:56 EDT Ventricular Rate:  60 PR Interval:    QRS Duration: 94 QT Interval:  452 QTC Calculation: 452 R Axis:   35 Text Interpretation: Sinus rhythm RSR' in V1 or V2, probably normal variant No significant change since last tracing Confirmed by [facility]  MD, Juanda Crumble [phone]) on 08/06/2019 12:03:30 PM    Radiology CT Head Wo Contrast  Result Date: 08/06/2019 CLINICAL DATA:  Onset left hand numbness when the patient woke up this morning. EXAM: CT HEAD WITHOUT CONTRAST TECHNIQUE: Contiguous axial images were obtained from the base of the skull through the vertex without intravenous contrast. COMPARISON:  None. FINDINGS: Brain: No evidence of acute infarction, hemorrhage, hydrocephalus, extra-axial collection or mass lesion/mass effect. Vascular: Atherosclerosis noted. Skull: Intact.  No focal lesion. Sinuses/Orbits: Status post cataract surgery.  Otherwise negative. Other: None. IMPRESSION: No acute abnormality. Atherosclerosis. Electronically Signed   By: Inge Rise M.D.   On: 08/06/2019 11:56    Procedures Procedures  Medications Ordered in the ED Medications - No data to display   ED Course  I have reviewed the triage vital signs and the nursing notes.  Pertinent labs & imaging results that were available during my care of the patient  were reviewed by me and considered in my medical decision making (see chart for details).  Clinical Course as of Aug 06 1322  Mon Aug 06, 2019  1314 Patient's presentation is most consistent with a paresthesia/peripheral neuropathy.  Does not have any focal neuro deficits concerning for central process such as a stroke.  His labs show a normal CBC and basic embolic panel and his CT scan was negative for signs of acute process.   [CS]  [id number] Reassessed, no significant change in his exam.  I discussed with the patient and his wife the differential diagnosis of paresthesia.  He has no other focal deficits concerning for stroke.  Recommend follow-up with primary care physician for reevaluation and to return to the emergency department if his symptoms worsen in any way.   [CS]    Clinical Course User Index [CS] Truddie Hidden, MD    MDM Rules/Calculators/A&P MDM  Final Clinical Impression(s) / ED Diagnoses Final diagnoses:  Paresthesia    Rx / DC Orders ED Discharge Orders    None       Truddie Hidden, MD 08/06/19 1324

## 2019-08-06 NOTE — ED Notes (Signed)
Pt on monitor 

## 2019-08-09 DIAGNOSIS — R200 Anesthesia of skin: Secondary | ICD-10-CM | POA: Diagnosis not present

## 2019-08-14 DIAGNOSIS — M5136 Other intervertebral disc degeneration, lumbar region: Secondary | ICD-10-CM | POA: Diagnosis not present

## 2019-08-14 DIAGNOSIS — M47816 Spondylosis without myelopathy or radiculopathy, lumbar region: Secondary | ICD-10-CM | POA: Diagnosis not present

## 2019-08-14 DIAGNOSIS — Z9889 Other specified postprocedural states: Secondary | ICD-10-CM | POA: Diagnosis not present

## 2019-08-14 DIAGNOSIS — M48062 Spinal stenosis, lumbar region with neurogenic claudication: Secondary | ICD-10-CM | POA: Diagnosis not present

## 2019-08-14 DIAGNOSIS — M5416 Radiculopathy, lumbar region: Secondary | ICD-10-CM | POA: Diagnosis not present

## 2019-08-14 DIAGNOSIS — M4310 Spondylolisthesis, site unspecified: Secondary | ICD-10-CM | POA: Diagnosis not present

## 2019-09-04 DIAGNOSIS — M48062 Spinal stenosis, lumbar region with neurogenic claudication: Secondary | ICD-10-CM | POA: Diagnosis not present

## 2019-09-04 DIAGNOSIS — I10 Essential (primary) hypertension: Secondary | ICD-10-CM | POA: Diagnosis not present

## 2019-09-27 DIAGNOSIS — G301 Alzheimer's disease with late onset: Secondary | ICD-10-CM | POA: Diagnosis not present

## 2019-09-27 DIAGNOSIS — E7800 Pure hypercholesterolemia, unspecified: Secondary | ICD-10-CM | POA: Diagnosis not present

## 2019-09-27 DIAGNOSIS — R69 Illness, unspecified: Secondary | ICD-10-CM | POA: Diagnosis not present

## 2019-09-27 DIAGNOSIS — I10 Essential (primary) hypertension: Secondary | ICD-10-CM | POA: Diagnosis not present

## 2019-09-27 DIAGNOSIS — I2510 Atherosclerotic heart disease of native coronary artery without angina pectoris: Secondary | ICD-10-CM | POA: Diagnosis not present

## 2019-11-26 DIAGNOSIS — R69 Illness, unspecified: Secondary | ICD-10-CM | POA: Diagnosis not present

## 2019-12-20 DIAGNOSIS — R69 Illness, unspecified: Secondary | ICD-10-CM | POA: Diagnosis not present

## 2019-12-20 DIAGNOSIS — G629 Polyneuropathy, unspecified: Secondary | ICD-10-CM | POA: Diagnosis not present

## 2019-12-20 DIAGNOSIS — Z79899 Other long term (current) drug therapy: Secondary | ICD-10-CM | POA: Diagnosis not present

## 2019-12-20 DIAGNOSIS — G301 Alzheimer's disease with late onset: Secondary | ICD-10-CM | POA: Diagnosis not present

## 2019-12-20 DIAGNOSIS — R269 Unspecified abnormalities of gait and mobility: Secondary | ICD-10-CM | POA: Diagnosis not present

## 2019-12-24 DIAGNOSIS — L814 Other melanin hyperpigmentation: Secondary | ICD-10-CM | POA: Diagnosis not present

## 2019-12-24 DIAGNOSIS — D0439 Carcinoma in situ of skin of other parts of face: Secondary | ICD-10-CM | POA: Diagnosis not present

## 2019-12-24 DIAGNOSIS — D225 Melanocytic nevi of trunk: Secondary | ICD-10-CM | POA: Diagnosis not present

## 2019-12-24 DIAGNOSIS — L821 Other seborrheic keratosis: Secondary | ICD-10-CM | POA: Diagnosis not present

## 2019-12-31 ENCOUNTER — Other Ambulatory Visit: Payer: Self-pay

## 2019-12-31 ENCOUNTER — Ambulatory Visit: Payer: Medicare HMO | Admitting: Cardiovascular Disease

## 2019-12-31 ENCOUNTER — Encounter: Payer: Self-pay | Admitting: Cardiovascular Disease

## 2019-12-31 VITALS — BP 102/56 | HR 60 | Ht 68.0 in | Wt 160.0 lb

## 2019-12-31 DIAGNOSIS — I2510 Atherosclerotic heart disease of native coronary artery without angina pectoris: Secondary | ICD-10-CM | POA: Diagnosis not present

## 2019-12-31 DIAGNOSIS — E782 Mixed hyperlipidemia: Secondary | ICD-10-CM | POA: Diagnosis not present

## 2019-12-31 NOTE — Progress Notes (Signed)
Alexander [name] Date of Birth  1940/08/05   [address]    [place], [place]  [address] [phone]  Fax  [phone]  Problem List 1. CAD- s/p stenting 2009 Alexander [name])  2. Hyperlipidemia 3. Peripheral Vascular  disease  4. Peripheral neuropathy  5. alzheimers Disease    Alexander [name] is a 79 year old gentleman with a history of coronary artery disease. Status post inferior wall myocardial infarction in 2009. He had a stent placed at that time. He is also status post cutting balloon procedure in 2009.  Has a history of hyperlipidemia.  He's done very well. He works out at [employer] on a regular basis. He's not had any episodes of chest pain or shortness of breath.  He still works out at [employer].   Sep 18, 2012:  Still working out at [employer].   He is having some memory problems and wants to decrease the dose of crestor.   Nov. 17, 2014:  Doing well, having some sinus problems.  No CP.   Still going to [facility] 5 days a week.   His most recent lipid profile had an LDL of 103 - he had tried to decrease his crestor to 10.  He is back on 20 mg a day.  October 05, 2013: Alexander [name] is a doing very well. He's a former patient of Dr. Doreatha [name].   He goes to the [employer]   Jan. 7, 2016: Alexander [name] is a 79 yo with hx of CAD and hyperlipidemia. [name] is not doing as well today.  Is having some back pain.  He recently saw Dr. Doreatha [name] at the [facility] .  He having some control issues with his right leg.    December 30, 2014:  Has some shortness of breath with exertion.  Very short of breath when he climbs 2 flights of stairs Has had some leg pain , wakes him up at night. Has known PVD   July 03, 2015  [name] had some shortness of breath .   Had an echo card gram revealed normal left ventricle systolic function. He did have mild diastolic dysfunction. Breathing is much better, not on any new meds.  Still goes to the [facility] and walks in the [place] ( [place] of the pool) Had back surgery Dec. 8, 2016  - had  lost all strength in his left leg  Has neuropathy in both feet.   BP and HR look great today   Oct. 2, 2017:  Was seen with his wife  [name]. No CP or dyspnea. Goes to the [facility] 5 days a week.   Was diagnosed with Alzheimers this past week,   Started Aricept last night   July 26, 2016:  Doing well.   Still goes to the [facility]  5 times a week ( goes at 6 AM every day )  Was seen with his wife  [name]. No CP or dyspnea Recent labs at Dr. Rex Kras - [facility]    Jan. 15, 2019:  doing well.   No CP , Was seen with his wife  [name]. Goes to the [facility] 5 days a week - 3 times a week for water aerobics and 2 days on the bike .  Dr. Rex Kras manages his chol.   January 30, 2018: Alexander [name] is seen today for follow-up of his coronary artery disease. BP is a bit lower than usual . No syncope or presyncope  Does not eat much salt .  Still going to  the [employer] 5 day days a week  No CP or dyspnea   With working out.   BP is low,   Will reduce his metoprolol      Aug. 28, 2020   Alexander [name] is seen today for follow up of his CAD , HLD, PVD. He also has some alzheimers disease Just restarted his water aerobics .   Aug. 30, 2021:  BP is low today  No symptoms  Is eating well .  No exercising , unable to exercise  No angina ,  Hx of CAD, HLD ,    Current Outpatient Medications on File Prior to Visit  Medication Sig Dispense Refill  . aspirin 81 MG tablet Take 81 mg by mouth daily.    . calcium citrate (CALCITRATE - DOSED IN MG ELEMENTAL CALCIUM) 950 MG tablet Take 200 mg of elemental calcium by mouth daily.    . cholecalciferol (VITAMIN D) 1000 UNITS tablet Take 1,000 Units by mouth daily.    . Coenzyme Q10 (CO Q 10) 10 MG CAPS Take 1 capsule by mouth daily.    Alexander [name] donepezil (ARICEPT) 10 MG tablet Take 10 mg by mouth daily.    . famotidine (PEPCID) 40 MG tablet Take 20 mg by mouth 2 (two) times daily.     Alexander [name] gabapentin (NEURONTIN) 600 MG tablet Take 600 mg by mouth 3 (three) times  daily.    . memantine (NAMENDA) 10 MG tablet Take 1 tablet by mouth 2 (two) times daily.    . metoprolol tartrate (LOPRESSOR) 25 MG tablet TAKE 0.5 TABLETS (12.5 MG TOTAL) BY MOUTH 2 (TWO) TIMES DAILY. 90 tablet 3  . naproxen (NAPROSYN) 500 MG tablet As directed    . naproxen sodium (ALEVE) 220 MG tablet Take 220 mg by mouth 2 (two) times daily.    . nitroGLYCERIN (NITROSTAT) 0.4 MG SL tablet Place 1 tablet (0.4 mg total) under the tongue every 5 (five) minutes as needed for chest pain. 25 tablet 1  . Omega-3 Fatty Acids (FISH OIL) 1000 MG CAPS Take 1,000 mg by mouth daily.    . ranitidine (ZANTAC) 150 MG capsule Take 1 capsule by mouth daily.  0  . rosuvastatin (CRESTOR) 40 MG tablet Take 40 mg by mouth daily.     No current facility-administered medications on file prior to visit.    No Known Allergies  Past Medical History:  Diagnosis Date  . Acute inferior myocardial infarction [facility])     Stent to right coronary on Sep 06, 2007 with 3.5 x 20 mm Liberte non-drug eluting stent  . Alzheimer disease ([place])   . Arthritis   . Coronary artery disease    Stent to prox RCA 2009 with cutting balloon to PDA  . Dementia ([place])    memory problems  . GERD (gastroesophageal reflux disease)   . History of tobacco abuse   . Hyperlipidemia   . Neuromuscular disorder [facility])     Past Surgical History:  Procedure Laterality Date  . BACK SURGERY   February 2009  . BELPHAROPTOSIS REPAIR    . CARDIAC CATHETERIZATION  2009  . LUMBAR LAMINECTOMY/DECOMPRESSION MICRODISCECTOMY Left 04/10/2015   Procedure: LUMBAR LAMINECTOMY/DECOMPRESSION MICRODISCECTOMY 1 LEVEL;  Surgeon: Alexander Gamma, MD;  Location: [place] [facility];  Service: Neurosurgery;  Laterality: Left;  Left L45 laminotomy  . TONSILLECTOMY      Social History   Tobacco Use  Smoking Status Former Smoker  Smokeless Tobacco Never Used    Social History   Substance and Sexual  Activity  Alcohol Use Yes   Comment: social drinks wine     Family History  Problem Relation Age of Onset  . Heart attack Father 43  . Cancer Mother   . Cancer Brother     Reviw of Systems:  Noted in current history, all other systems are negative.  Physical Exam: Blood pressure (!) 102/56, pulse 60, height 5\' 8"  (1.727 m), weight 160 lb (72.6 kg), SpO2 94 %.  GEN:   HEENT: Normal NECK: No JVD; No carotid bruits LYMPHATICS: No lymphadenopathy CARDIAC: RRR , no murmurs, rubs, gallops RESPIRATORY:  Clear to auscultation without rales, wheezing or rhonchi  ABDOMEN: Soft, non-tender, non-distended MUSCULOSKELETAL:  No edema; No deformity  SKIN: Warm and dry NEUROLOGIC:  Alert and oriented x 3    EKG:  Aug. 28 2020 sinus brady at 54.       Assessment / Plan:   1. CAD- s/p stenting 2009 ( [place])  No angina  Limited by his dementia     .   2. Hyperlipidemia -    Will see in a year.   Follow up with APP with lipids, liver m, bmp     3. Peripheral vascular disease: no claudication   4. Peripheral neuropathy :    5.  Alzheimers Disease - progressing   Mertie Moores, MD  12/31/2019 10:04 AM    [facility] [place],  [place], [place]  [address] Pager [phone] Phone: [phone]; Fax: [phone]

## 2019-12-31 NOTE — Patient Instructions (Signed)
Medication Instructions:  Your physician recommends that you continue on your current medications as directed. Please refer to the Current Medication list given to you today.  *If you need a refill on your cardiac medications before your next appointment, please call your pharmacy*   Lab Work: Your physician recommends that you return for lab work in: 12 months on the day of or a few days before your office visit with Dr. Acie Fredrickson.  You will need to FAST for this appointment - nothing to eat or drink after midnight the night before except water.  If you have labs (blood work) drawn today and your tests are completely normal, you will receive your results only by: [name] MyChart Message (if you have MyChart) OR . A paper copy in the mail If you have any lab test that is abnormal or we need to change your treatment, we will call you to review the results.    Testing/Procedures: None Ordered    Follow-Up: At [facility], you and your health needs are our priority.  As part of our continuing mission to provide you with exceptional heart care, we have created designated Provider Care Teams.  These Care Teams include your primary Cardiologist (physician) and Advanced Practice Providers (APPs -  Physician Assistants and Nurse Practitioners) who all work together to provide you with the care you need, when you need it.  We recommend signing up for the patient portal called "MyChart".  Sign up information is provided on this After Visit Summary.  MyChart is used to connect with patients for Virtual Visits (Telemedicine).  Patients are able to view lab/test results, encounter notes, upcoming appointments, etc.  Non-urgent messages can be sent to your provider as well.   To learn more about what you can do with MyChart, go to [email or web address].    Your next appointment:   1 year(s)  The format for your next appointment:   In Person  Provider:   You may see Mertie Moores, MD or one of the  following Advanced Practice Providers on your designated Care Team:    Richardson Dopp, PA-C  [place], [place]

## 2020-01-12 ENCOUNTER — Other Ambulatory Visit: Payer: Self-pay | Admitting: Cardiovascular Disease

## 2020-03-17 DIAGNOSIS — Z961 Presence of intraocular lens: Secondary | ICD-10-CM | POA: Diagnosis not present

## 2020-03-17 DIAGNOSIS — H524 Presbyopia: Secondary | ICD-10-CM | POA: Diagnosis not present

## 2020-03-17 DIAGNOSIS — H52203 Unspecified astigmatism, bilateral: Secondary | ICD-10-CM | POA: Diagnosis not present

## 2020-03-17 DIAGNOSIS — H35373 Puckering of macula, bilateral: Secondary | ICD-10-CM | POA: Diagnosis not present

## 2020-05-06 DIAGNOSIS — H903 Sensorineural hearing loss, bilateral: Secondary | ICD-10-CM | POA: Diagnosis not present

## 2020-05-09 DIAGNOSIS — Z0000 Encounter for general adult medical examination without abnormal findings: Secondary | ICD-10-CM | POA: Diagnosis not present

## 2020-05-09 DIAGNOSIS — E7800 Pure hypercholesterolemia, unspecified: Secondary | ICD-10-CM | POA: Diagnosis not present

## 2020-05-09 DIAGNOSIS — I2510 Atherosclerotic heart disease of native coronary artery without angina pectoris: Secondary | ICD-10-CM | POA: Diagnosis not present

## 2020-05-09 DIAGNOSIS — I739 Peripheral vascular disease, unspecified: Secondary | ICD-10-CM | POA: Diagnosis not present

## 2020-05-09 DIAGNOSIS — G301 Alzheimer's disease with late onset: Secondary | ICD-10-CM | POA: Diagnosis not present

## 2020-05-09 DIAGNOSIS — Z23 Encounter for immunization: Secondary | ICD-10-CM | POA: Diagnosis not present

## 2020-05-09 DIAGNOSIS — K219 Gastro-esophageal reflux disease without esophagitis: Secondary | ICD-10-CM | POA: Diagnosis not present

## 2020-05-09 DIAGNOSIS — M5136 Other intervertebral disc degeneration, lumbar region: Secondary | ICD-10-CM | POA: Diagnosis not present

## 2020-05-09 DIAGNOSIS — I10 Essential (primary) hypertension: Secondary | ICD-10-CM | POA: Diagnosis not present

## 2020-05-15 DIAGNOSIS — H903 Sensorineural hearing loss, bilateral: Secondary | ICD-10-CM | POA: Insufficient documentation

## 2020-05-15 DIAGNOSIS — Z974 Presence of external hearing-aid: Secondary | ICD-10-CM | POA: Diagnosis not present

## 2020-05-15 DIAGNOSIS — H6123 Impacted cerumen, bilateral: Secondary | ICD-10-CM | POA: Diagnosis not present

## 2020-12-01 DIAGNOSIS — G301 Alzheimer's disease with late onset: Secondary | ICD-10-CM | POA: Diagnosis not present

## 2020-12-01 DIAGNOSIS — M21372 Foot drop, left foot: Secondary | ICD-10-CM | POA: Diagnosis not present

## 2020-12-01 DIAGNOSIS — G629 Polyneuropathy, unspecified: Secondary | ICD-10-CM | POA: Diagnosis not present

## 2020-12-01 DIAGNOSIS — I10 Essential (primary) hypertension: Secondary | ICD-10-CM | POA: Diagnosis not present

## 2020-12-25 ENCOUNTER — Other Ambulatory Visit: Payer: Self-pay

## 2020-12-25 ENCOUNTER — Ambulatory Visit: Payer: Medicare HMO | Admitting: Cardiovascular Disease

## 2020-12-25 ENCOUNTER — Other Ambulatory Visit: Payer: Medicare HMO | Admitting: *Deleted

## 2020-12-25 DIAGNOSIS — E782 Mixed hyperlipidemia: Secondary | ICD-10-CM | POA: Diagnosis not present

## 2020-12-25 DIAGNOSIS — I2510 Atherosclerotic heart disease of native coronary artery without angina pectoris: Secondary | ICD-10-CM | POA: Diagnosis not present

## 2020-12-25 LAB — LIPID PANEL
Chol/HDL Ratio: 2.6 ratio (ref 0.0–5.0)
Cholesterol, Total: 170 mg/dL (ref 100–199)
HDL: 65 mg/dL (ref >39)
LDL Chol Calc (NIH): 88 mg/dL (ref 0–99)
Triglycerides: 93 mg/dL (ref 0–149)
VLDL Cholesterol Cal: 17 mg/dL (ref 5–40)

## 2020-12-25 LAB — BASIC METABOLIC PANEL
BUN/Creatinine Ratio: 17 (ref 10–24)
BUN: 20 mg/dL (ref 8–27)
CO2: 23 mmol/L (ref 20–29)
Calcium: 9.8 mg/dL (ref 8.6–10.2)
Chloride: 104 mmol/L (ref 96–106)
Creatinine, Ser: 1.15 mg/dL (ref 0.76–1.27)
Glucose: 93 mg/dL (ref 65–99)
Potassium: 4.5 mmol/L (ref 3.5–5.2)
Sodium: 143 mmol/L (ref 134–144)
eGFR: 65 mL/min/{1.73_m2} (ref >59)

## 2020-12-25 LAB — HEPATIC FUNCTION PANEL
ALT: 22 IU/L (ref 0–44)
AST: 29 IU/L (ref 0–40)
Albumin: 4.7 g/dL (ref 3.7–4.7)
Alkaline Phosphatase: 52 IU/L (ref 44–121)
Bilirubin Total: 0.4 mg/dL (ref 0.0–1.2)
Bilirubin, Direct: 0.11 mg/dL (ref 0.00–0.40)
Total Protein: 6.9 g/dL (ref 6.0–8.5)

## 2020-12-29 ENCOUNTER — Encounter: Payer: Self-pay | Admitting: Cardiovascular Disease

## 2020-12-29 ENCOUNTER — Other Ambulatory Visit: Payer: Self-pay

## 2020-12-29 ENCOUNTER — Ambulatory Visit: Payer: Medicare HMO | Admitting: Cardiovascular Disease

## 2020-12-29 VITALS — BP 128/66 | HR 57 | Ht 68.0 in | Wt 165.2 lb

## 2020-12-29 DIAGNOSIS — I2510 Atherosclerotic heart disease of native coronary artery without angina pectoris: Secondary | ICD-10-CM | POA: Diagnosis not present

## 2020-12-29 DIAGNOSIS — E782 Mixed hyperlipidemia: Secondary | ICD-10-CM

## 2020-12-29 NOTE — Progress Notes (Signed)
Alexander [name] Date of Birth  1940/08/05   [address]    [place], [place]  [address] [phone]  Fax  [phone]  Problem List 1. CAD- s/p stenting 2009 Alexander [name])  2. Hyperlipidemia 3. Peripheral Vascular  disease  4. Peripheral neuropathy  5. alzheimers Disease    Alexander [name] is a 80 year old gentleman with a history of coronary artery disease. Status post inferior wall myocardial infarction in 2009. He had a stent placed at that time. He is also status post cutting balloon procedure in 2009.  Has a history of hyperlipidemia.  He's done very well. He works out at [employer] on a regular basis. He's not had any episodes of chest pain or shortness of breath.  He still works out at [employer].   Sep 18, 2012:  Still working out at [employer].   He is having some memory problems and wants to decrease the dose of crestor.   Nov. 17, 2014:  Doing well, having some sinus problems.  No CP.   Still going to [facility] 5 days a week.   His most recent lipid profile had an LDL of 103 - he had tried to decrease his crestor to 10.  He is back on 20 mg a day.  October 05, 2013: Alexander [name] is a doing very well. He's a former patient of Dr. Doreatha [name].   He goes to the [employer]   Jan. 7, 2016: Alexander [name] is a 80 yo with hx of CAD and hyperlipidemia. [name] is not doing as well today.  Is having some back pain.  He recently saw Dr. Doreatha [name] at the [facility] .  He having some control issues with his right leg.    December 30, 2014:  Has some shortness of breath with exertion.  Very short of breath when he climbs 2 flights of stairs Has had some leg pain , wakes him up at night. Has known PVD   July 03, 2015  [name] had some shortness of breath .   Had an echo card gram revealed normal left ventricle systolic function. He did have mild diastolic dysfunction. Breathing is much better, not on any new meds.  Still goes to the [facility] and walks in the [place] ( [place] of the pool) Had back surgery Dec. 8, 2016  - had  lost all strength in his left leg  Has neuropathy in both feet.   BP and HR look great today   Oct. 2, 2017:  Was seen with his wife  [name]. No CP or dyspnea. Goes to the [facility] 5 days a week.   Was diagnosed with Alzheimers this past week,   Started Aricept last night   July 26, 2016:  Doing well.   Still goes to the [facility]  5 times a week ( goes at 6 AM every day )  Was seen with his wife  [name]. No CP or dyspnea Recent labs at Dr. Rex Kras - [facility]    Jan. 15, 2019:  doing well.   No CP , Was seen with his wife  [name]. Goes to the [facility] 5 days a week - 3 times a week for water aerobics and 2 days on the bike .  Dr. Rex Kras manages his chol.   January 30, 2018: Alexander [name] is seen today for follow-up of his coronary artery disease. BP is a bit lower than usual . No syncope or presyncope  Does not eat much salt .  Still going to  the [employer] 5 day days a week  No CP or dyspnea   With working out.   BP is low,   Will reduce his metoprolol      Aug. 28, 2020   Alexander [name] is seen today for follow up of his CAD , HLD, PVD. He also has some alzheimers disease Just restarted his water aerobics .   Aug. 30, 2021:  BP is low today  No symptoms  Is eating well .  No exercising , unable to exercise  No angina ,  Hx of CAD, HLD ,   Aug. 29, 2022:   Alexander [name] is seen today for follow up of his CAD  Progressive dementia  Seen with wife, [name]  Dealing with water damage from a broken washing machine  Current Outpatient Medications on File Prior to Visit  Medication Sig Dispense Refill   aspirin 81 MG tablet Take 81 mg by mouth daily.     calcium citrate (CALCITRATE - DOSED IN MG ELEMENTAL CALCIUM) 950 MG tablet Take 200 mg of elemental calcium by mouth daily.     cholecalciferol (VITAMIN D) 1000 UNITS tablet Take 1,000 Units by mouth daily.     Coenzyme Q10 (CO Q 10) 10 MG CAPS Take 1 capsule by mouth daily.     donepezil (ARICEPT) 10 MG tablet Take 10 mg by mouth  daily.     gabapentin (NEURONTIN) 600 MG tablet Take 600 mg by mouth 3 (three) times daily.     memantine (NAMENDA) 10 MG tablet Take 1 tablet by mouth 2 (two) times daily.     metoprolol tartrate (LOPRESSOR) 25 MG tablet TAKE 0.5 TABLETS (12.5 MG TOTAL) BY MOUTH 2 (TWO) TIMES DAILY. 90 tablet 3   naproxen (NAPROSYN) 500 MG tablet As directed     naproxen sodium (ALEVE) 220 MG tablet Take 220 mg by mouth 2 (two) times daily.     nitroGLYCERIN (NITROSTAT) 0.4 MG SL tablet Place 1 tablet (0.4 mg total) under the tongue every 5 (five) minutes as needed for chest pain. 25 tablet 1   Omega-3 Fatty Acids (FISH OIL) 1000 MG CAPS Take 1,000 mg by mouth daily.     ranitidine (ZANTAC) 150 MG capsule Take 1 capsule by mouth daily.  0   rosuvastatin (CRESTOR) 40 MG tablet Take 40 mg by mouth daily.     famotidine (PEPCID) 20 MG tablet Take 20 mg by mouth at bedtime as needed.     No current facility-administered medications on file prior to visit.    No Known Allergies  Past Medical History:  Diagnosis Date   Acute inferior myocardial infarction [facility])     Stent to right coronary on Sep 06, 2007 with 3.5 x 20 mm Liberte non-drug eluting stent   Alzheimer disease ([place])    Arthritis    Coronary artery disease    Stent to prox RCA 2009 with cutting balloon to PDA   Dementia [facility])    memory problems   GERD (gastroesophageal reflux disease)    History of tobacco abuse    Hyperlipidemia    Neuromuscular disorder [facility])     Past Surgical History:  Procedure Laterality Date   BACK SURGERY   February 2009   BELPHAROPTOSIS REPAIR     CARDIAC CATHETERIZATION  2009   LUMBAR LAMINECTOMY/DECOMPRESSION MICRODISCECTOMY Left 04/10/2015   Procedure: LUMBAR LAMINECTOMY/DECOMPRESSION MICRODISCECTOMY 1 LEVEL;  Surgeon: Jovita Gamma, MD;  Location: [facility];  Service: Neurosurgery;  Laterality: Left;  Left L45 laminotomy  TONSILLECTOMY      Social History   Tobacco Use  Smoking Status Former   Smokeless Tobacco Never    Social History   Substance and Sexual Activity  Alcohol Use Yes   Comment: social drinks wine    Family History  Problem Relation Age of Onset   Heart attack Father 43   Cancer Mother    Cancer Brother     Reviw of Systems:  Noted in current history, all other systems are negative.  Physical Exam: Blood pressure 128/66, pulse (!) 57, height '5\' 8"'$  (1.727 m), weight 165 lb 3.2 oz (74.9 kg), SpO2 97 %.  GEN:  Well nourished, well developed in no acute distress HEENT: Normal NECK: No JVD; No carotid bruits LYMPHATICS: No lymphadenopathy CARDIAC: RRR , no murmurs, rubs, gallops RESPIRATORY:  Clear to auscultation without rales, wheezing or rhonchi  ABDOMEN: Soft, non-tender, non-distended MUSCULOSKELETAL:  No edema; No deformity  SKIN: Warm and dry NEUROLOGIC:  + dementia     EKG: December 29, 2020: Sinus bradycardia 57.  No ST or T wave changes.    Assessment / Plan:   1. CAD- s/p stenting 2009 ( [place])   Doing well  No angina  Cont current meds.    .   2. Hyperlipidemia -     Labs from last week look good     3. Peripheral vascular disease:    4. Peripheral neuropathy :    5.  Alzheimers Disease - progressing,   wife did most of the talking today    Mertie Moores, MD  12/29/2020 10:57 AM    [facility] [place],  [place], [place]  [address] Pager [phone] Phone: [phone]; Fax: [phone]

## 2020-12-29 NOTE — Patient Instructions (Signed)
Medication Instructions:  Your physician recommends that you continue on your current medications as directed. Please refer to the Current Medication list given to you today.  *If you need a refill on your cardiac medications before your next appointment, please call your pharmacy*   Lab Work: Your physician recommends that you return for lab work in: 12 months on the day of or a few days before your office visit. You will need to FAST for this appointment - nothing to eat or drink after midnight the night before except water.  If you have labs (blood work) drawn today and your tests are completely normal, you will receive your results only by: [place] (if you have MyChart) OR A paper copy in the mail If you have any lab test that is abnormal or we need to change your treatment, we will call you to review the results.   Testing/Procedures: None Ordered   Follow-Up: At [facility], you and your health needs are our priority.  As part of our continuing mission to provide you with exceptional heart care, we have created designated Provider Care Teams.  These Care Teams include your primary Cardiologist (physician) and Advanced Practice Providers (APPs -  Physician Assistants and Nurse Practitioners) who all work together to provide you with the care you need, when you need it.  We recommend signing up for the patient portal called "MyChart".  Sign up information is provided on this After Visit Summary.  MyChart is used to connect with patients for Virtual Visits (Telemedicine).  Patients are able to view lab/test results, encounter notes, upcoming appointments, etc.  Non-urgent messages can be sent to your provider as well.   To learn more about what you can do with MyChart, go to [email or web address].    Your next appointment:   1 year(s)  The format for your next appointment:   In Person  Provider:   You may see Mertie Moores, MD or one of the following Advanced Practice  Providers on your designated Care Team:   Richardson Dopp, PA-C [place], [place]

## 2020-12-31 DIAGNOSIS — L821 Other seborrheic keratosis: Secondary | ICD-10-CM | POA: Diagnosis not present

## 2020-12-31 DIAGNOSIS — D225 Melanocytic nevi of trunk: Secondary | ICD-10-CM | POA: Diagnosis not present

## 2020-12-31 DIAGNOSIS — D2261 Melanocytic nevi of right upper limb, including shoulder: Secondary | ICD-10-CM | POA: Diagnosis not present

## 2020-12-31 DIAGNOSIS — L570 Actinic keratosis: Secondary | ICD-10-CM | POA: Diagnosis not present

## 2020-12-31 DIAGNOSIS — L814 Other melanin hyperpigmentation: Secondary | ICD-10-CM | POA: Diagnosis not present

## 2020-12-31 DIAGNOSIS — C4441 Basal cell carcinoma of skin of scalp and neck: Secondary | ICD-10-CM | POA: Diagnosis not present

## 2020-12-31 DIAGNOSIS — Z85828 Personal history of other malignant neoplasm of skin: Secondary | ICD-10-CM | POA: Diagnosis not present

## 2021-01-01 ENCOUNTER — Encounter: Payer: Self-pay | Admitting: Neurology

## 2021-01-01 ENCOUNTER — Ambulatory Visit: Payer: Medicare HMO | Admitting: Neurology

## 2021-01-01 VITALS — BP 145/70 | HR 53 | Ht 68.0 in | Wt 164.5 lb

## 2021-01-01 DIAGNOSIS — G8929 Other chronic pain: Secondary | ICD-10-CM | POA: Diagnosis not present

## 2021-01-01 DIAGNOSIS — M545 Low back pain, unspecified: Secondary | ICD-10-CM

## 2021-01-01 DIAGNOSIS — G301 Alzheimer's disease with late onset: Secondary | ICD-10-CM | POA: Diagnosis not present

## 2021-01-01 DIAGNOSIS — M21372 Foot drop, left foot: Secondary | ICD-10-CM

## 2021-01-01 DIAGNOSIS — M5136 Other intervertebral disc degeneration, lumbar region: Secondary | ICD-10-CM | POA: Diagnosis not present

## 2021-01-01 DIAGNOSIS — F0280 Dementia in other diseases classified elsewhere without behavioral disturbance: Secondary | ICD-10-CM

## 2021-01-01 DIAGNOSIS — R69 Illness, unspecified: Secondary | ICD-10-CM | POA: Diagnosis not present

## 2021-01-01 DIAGNOSIS — Z9181 History of falling: Secondary | ICD-10-CM | POA: Diagnosis not present

## 2021-01-01 DIAGNOSIS — R269 Unspecified abnormalities of gait and mobility: Secondary | ICD-10-CM | POA: Diagnosis not present

## 2021-01-01 NOTE — Patient Instructions (Addendum)
Continue Aricept/Namenda AQua therapy new [facility] [place]  Alzheimer's Disease Alzheimer's disease is a brain disease that affects memory, thinking, language, and behavior. People with Alzheimer's disease lose mental abilities, and the disease gets worse over time. Alzheimer's disease is a form of dementia. What are the causes? This condition develops when a protein called beta-amyloid forms deposits in the brain. It is not known what causes these deposits to form. Alzheimer's disease may also be caused by a gene mutation that is inherited from one parent or both parents. A gene mutation is a harmful change in a gene. Not everyone who inherits the genetic mutation will get the disease. What increases the risk? You are more likely to develop this condition if you: Are older than age 41. Are male. Have any of these medical conditions: High blood pressure. Diabetes. Heart or blood vessel disease. Smoke. Have obesity. Have had a brain injury. Have had a stroke. Have a family history of dementia. What are the signs or symptoms? Symptoms of this condition may happen in three stages, which often overlap. Early stage In this stage, you may continue to be independent. You may still be able to drive, work, and be social. Symptoms in this stage include: Minor memory problems, such as forgetting a name, words, or what you did recently. Difficulty with: Paying attention. Communicating. Doing familiar tasks. Problem solving or doing calculations. Following instructions. Learning new things. Anxiety. Social withdrawal. Loss of motivation. Moderate stage In this stage, you will start to need care. Symptoms in this stage include: Difficulty with expressing thoughts. Memory loss that affects daily life. This can include forgetting: Recent events that have happened. If you have taken medicines or eaten. Familiar places. You may get  lost while walking or driving. To pay bills or manage finances. Personal hygiene such as bathing or using the bathroom. Confusion about where you are or what time it is. Difficulty in judging distance. Changes in personality, mood, and behavior. You may be moody, irritable, angry, frustrated, fearful, anxious, or suspicious. Poor reasoning and judgment. Delusions or hallucinations. Changes in sleep patterns. Severe stage In the final stage, you will need help with your personal care and daily activities. Symptoms in this stage include: Worsening memory loss. Personality changes. Loss of awareness of your surroundings. Changes in physical abilities, including the ability to walk, sit, and swallow. Difficulty in communicating. Inability to control your bladder and bowels. Increasing confusion. Increasing behavior changes. How is this diagnosed? This condition is diagnosed by a health care provider who specializes in diseases of the nervous system (neurologist) or one who specializes in care of the elderly (geriatrician or geriatric psychiatrist). Other causes of dementia may also be ruled out. Your health care provider will talk with you and your family, friends, or caregivers about your history and symptoms. A thorough medical history will be taken, and you will have a physical exam and tests. Tests may include: Lab tests, such as blood or urine tests. Imaging tests, such as a CT scan, a PET scan, or an MRI. A lumbar puncture. This test involves removing and testing a small amount of the fluid that surrounds the brain and spinal cord. An electroencephalogram (EEG). In this test, small metal discs are used to measure electrical activity in the brain. Memory tests, cognitive tests, and neuropsychological tests. These tests evaluate brain function. Genetic testing. This may be done if you have early onset of the disease (before age 57) or  if other family members have the disease. How is this  treated? At this time, there is no treatment to cure Alzheimer's disease or stop it from getting worse. The goals of treatment are: To manage behavioral changes. To provide you with a safe environment. To help manage daily life for you and your caregivers. The following treatment options are available: Medicines. Medicines may help the memory work better and manage behavioral symptoms. Cognitive therapy. Cognitive therapy provides you with education, support, and memory aids. It is most helpful in the early stages of the condition. Counseling or spiritual guidance. It is normal to have a lot of feelings, including anger, relief, fear, and isolation. Counseling and guidance can help you deal with these feelings. Caregiving. This involves having caregivers help you with your daily activities. Family support groups. These provide education, emotional support, and information about community resources to family members who are taking care of you. Follow these instructions at home: Medicines Take over-the-counter and prescription medicines only as told by your health care provider. Use a pill organizer or pill reminder to help you manage your medicines. Avoid taking medicines that can affect thinking, such as pain medicines or sleeping medicines. Lifestyle Make healthy lifestyle choices: Be physically active as told by your health care provider. Regular exercise may help improve symptoms. Do not use any products that contain nicotine or tobacco, such as cigarettes, e-cigarettes, and chewing tobacco. If you need help quitting, ask your health care provider. Do not drink alcohol. Eat a healthy diet. Practice stress-management techniques when you get stressed. Stay social. Drink enough fluid to keep your urine pale yellow. Make sure to get quality sleep. Avoid taking long naps during the day. Take short naps of 30 minutes or less if needed. Keep your sleeping area dark and cool. Avoid exercising  during the few hours before you go to bed. Avoid caffeine products in the afternoon and evening. General instructions Work with your health care provider to determine what you need help with and what your safety needs are. If you were given a bracelet that identifies you as a person with memory loss or tracks your location, make sure to wear it at all times. Talk with your health care provider about whether it is safe for you to drive. Work with your family to make important decisions, such as advance directives, medical power of attorney, or a living will. Keep all follow-up visits. This is important. Where to find more information The Alzheimer's Association: Call the 24-hour helpline at 1-816-687-0663, or visit CapitalMile.co.nz Contact a health care provider if: You have nausea, vomiting, or trouble with eating related to a medicine. You have worsening mood or behavior changes, such as depression, anxiety, or hallucinations. You or your family members become concerned for your safety. Get help right away if: You become less responsive or are difficult to wake up. Your memory suddenly gets worse. You feel that you want to harm yourself. If you ever feel like you may hurt yourself or others, or have thoughts about taking your own life, get help right away. Go to your nearest emergency department or: Call your local emergency services (911 in the [place] Call a suicide crisis helpline, such as the [place] at 519 421 8921. This is open 24 hours a day in the [place] Text the Crisis Text Line at 870-480-7032 (in the [place] Summary Alzheimer's disease is a brain disease that affects memory, thinking, language, and behavior. Alzheimer's disease is a form of dementia. This condition  is diagnosed by a specialist in diseases of the nervous system (neurologist) or one who specializes in care of the elderly. At this time, there is no treatment to cure Alzheimer's disease or stop it  from getting worse. The goal of treatment is to help you manage any symptoms. Work with your family to make important decisions, such as advance directives, medical power of attorney, or a living will. This information is not intended to replace advice given to you by your health care provider. Make sure you discuss any questions you have with your health care provider. Document Revised: 08/06/2019 Document Reviewed: 08/06/2019 Elsevier Patient Education  2022 [employer].

## 2021-01-01 NOTE — Progress Notes (Signed)
[facility]    Provider:  Dr Jaynee Eagles Requesting Provider: Hulan Fess, MD Primary Care Provider:  Lawerance Cruel, MD  CC:  alzheimer disease  HPI:  Alexander [name] is a 80 y.o. male here as requested by Hulan Fess, MD for alzheimer disease.  Past medical history intervertebral disc degeneration lumbar region, Alzheimer's disease with late onset, coronary artery disease and a history of MI, peripheral vascular disease, hypertension, hypercholesterolemia.  I reviewed Dr. Alan Ripper notes, patient is already on Aricept and memantine, he has already been seen by [facility] and I reviewed their notes as well, it appears as though he has been doing well since seeing [facility], I reviewed Neurology notes as wee: Lenox Ponds was added in January 2019 at that time his MoCA was a 19 out of 30, no signs of agitation irritation or anger, they continue to go to the [facility] 5 days a week, he attends water aerobics class, no trouble managing himself inside and out of the locker room, he still attends the [employer] and meetings every month, they have a close group of friends have known for greater than 40 years and they continue to socialize, he has no delusions, hallucinations, agitation, depression, anxiety, apathy disinhibition and irritability no disturbance nighttime behaviors changes in appetite or eating, his neurologic and physical exam appear relatively unremarkable, diagnosed with late onset Alzheimer's disease without behavioral disturbance, initial evaluation was on September 2017 with dementia most likely Alzheimer's of moderate severity and that is when he started the Aricept.  The whole family is here, wife and daughter provide most information, diagnosed with late onset alzheimers, wife is his primary caregiver, they still socialize often, there are no behavioral problems, they have a lot of support, still in their house of 15 years, good neighbors,  and a wonderful daughter and son. Patient always has a great sense of humor, no behavioral issues, no sleeping issues, he will get up and in the middle of the night to urinate but thee is a light on, no snoring, he naps a little during the day but sleeps well as night, he has had 3 back surgeries and he is on Gabapentin, they used to go to Dr. Maryjean Ka but they don;t need any injections and we discussed the new pain management doctor at [place] [facility] should he need, he is not hurting, the gabapentin may make him more tired but it helps with pain. He struglles with his words. Appetite is good, no falls, hydration is good, getting out socially. No significant caregiver stress, wife is lovely and so is daughter. Wif helps him with dressing, he can get a little confused, [facility] for him to be independent, they have a recumbent bike. He is still a gentleman.   Reviewed notes, labs and imaging from outside physicians, which showed: see above for review of notes also:  Patient's wife is primary caregiver, memory unit nursing homes not necessary at this point according to his wife, B12 and thyroid has been checked in the past per notes last thyroid was normal, last CBC and CMP were unremarkable with BUN 22 and creatinine 1.22.    Review of Systems: Patient complains of symptoms per HPI as well as the following symptoms Alzheimers.. Pertinent negatives and positives per HPI. All others negative.   Social History   Socioeconomic History   Marital status: Married    Spouse name: Not on file  Number of children: Not on file   Years of education: Not on file   Highest education level: Not on file  Occupational History   Not on file  Tobacco Use   Smoking status: Former   Smokeless tobacco: Never  Vaping Use   Vaping Use: Never used  Substance and Sexual Activity   Alcohol use: Yes    Comment: social drinks wine   Drug use: No   Sexual activity:  Not on file  Other Topics Concern   Not on file  Social History Narrative   Not on file   Social Determinants of Health   Financial Resource Strain: Not on file  Food Insecurity: Not on file  Transportation Needs: Not on file  Physical Activity: Not on file  Stress: Not on file  Social Connections: Not on file  Intimate Partner Violence: Not on file    Family History  Problem Relation Age of Onset   Heart attack Father 75   Cancer Mother    Cancer Brother     Past Medical History:  Diagnosis Date   Acute inferior myocardial infarction [facility])     Stent to right coronary on Sep 06, 2007 with 3.5 x 20 mm Liberte non-drug eluting stent   Alzheimer disease ([place])    Arthritis    Coronary artery disease    Stent to prox RCA 2009 with cutting balloon to PDA   Dementia ([place])    memory problems   GERD (gastroesophageal reflux disease)    History of tobacco abuse    Hyperlipidemia    Neuromuscular disorder [facility])     Patient Active Problem List   Diagnosis Date Noted   Lumbar degenerative disc disease 01/05/2021   Chronic bilateral low back pain 01/05/2021   Gait abnormality 01/05/2021   Late onset Alzheimer's dementia without behavioral disturbance ([place]) 01/05/2021   Left foot drop 01/05/2021   HNP (herniated nucleus pulposus), lumbar 04/10/2015   PAD (peripheral artery disease) ([place]) 05/09/2014   CAD (coronary artery disease) 03/03/2011   Hyperlipidemia 03/03/2011    Past Surgical History:  Procedure Laterality Date   BACK SURGERY   February 2009   BELPHAROPTOSIS REPAIR     CARDIAC CATHETERIZATION  2009   LUMBAR LAMINECTOMY/DECOMPRESSION MICRODISCECTOMY Left 04/10/2015   Procedure: LUMBAR LAMINECTOMY/DECOMPRESSION MICRODISCECTOMY 1 LEVEL;  Surgeon: Jovita Gamma, MD;  Location: [facility];  Service: Neurosurgery;  Laterality: Left;  Left L45 laminotomy   TONSILLECTOMY      Current Outpatient Medications  Medication Sig Dispense Refill   aspirin 81 MG tablet  Take 81 mg by mouth daily.     calcium citrate (CALCITRATE - DOSED IN MG ELEMENTAL CALCIUM) 950 MG tablet Take 200 mg of elemental calcium by mouth daily.     cholecalciferol (VITAMIN D) 1000 UNITS tablet Take 1,000 Units by mouth daily.     Coenzyme Q10 (CO Q 10) 10 MG CAPS Take 1 capsule by mouth daily.     famotidine (PEPCID) 20 MG tablet Take 20 mg by mouth at bedtime as needed.     gabapentin (NEURONTIN) 600 MG tablet Take 600 mg by mouth 3 (three) times daily.     naproxen (NAPROSYN) 500 MG tablet As directed     naproxen sodium (ALEVE) 220 MG tablet Take 220 mg by mouth 2 (two) times daily.     nitroGLYCERIN (NITROSTAT) 0.4 MG SL tablet Place 1 tablet (0.4 mg total) under the tongue every 5 (five) minutes as needed for chest pain. 25 tablet  1   Omega-3 Fatty Acids (FISH OIL) 1000 MG CAPS Take 1,000 mg by mouth daily.     ranitidine (ZANTAC) 150 MG capsule Take 1 capsule by mouth daily.  0   rosuvastatin (CRESTOR) 40 MG tablet Take 40 mg by mouth daily.     donepezil (ARICEPT) 10 MG tablet Take 1 tablet (10 mg total) by mouth 2 (two) times daily. 180 tablet 4   memantine (NAMENDA) 10 MG tablet Take 1 tablet (10 mg total) by mouth 2 (two) times daily. 180 tablet 4   metoprolol tartrate (LOPRESSOR) 25 MG tablet TAKE 0.5 TABLETS (12.5 MG TOTAL) BY MOUTH 2 (TWO) TIMES DAILY. 90 tablet 3   No current facility-administered medications for this visit.    Allergies as of 01/01/2021   (No Known Allergies)    Vitals: BP (!) 145/70   Pulse (!) 53   Ht '5\' 8"'$  (1.727 m)   Wt 164 lb 8 oz (74.6 kg)   BMI 25.01 kg/m  Last Weight:  Wt Readings from Last 1 Encounters:  01/01/21 164 lb 8 oz (74.6 kg)   Last Height:   Ht Readings from Last 1 Encounters:  01/01/21 '5\' 8"'$  (1.727 m)     Physical exam: Exam: Gen: NAD, conversant, well nourised, well groomed                     CV: RRR, no MRG. No Carotid Bruits. No peripheral edema, warm, nontender Eyes: Conjunctivae clear without exudates or  hemorrhage  Neuro: Detailed Neurologic Exam  Speech:    Speech is normal; fluent  Cognition:  MMSE - Mini Mental State Exam 01/01/2021  Orientation to time 1  Orientation to Place 1  Registration 3  Attention/ Calculation 0  Recall 0  Language- name 2 objects 2  Language- repeat 1  Language- follow 3 step command 3  Language- read & follow direction 0  Write a sentence 0  Copy design 0  Total score 11    Cranial Nerves:    The pupils are equal, round, and reactive to light. Pupils too small to visualize fundi.  Visual fields are full to threat. Extraocular movements are intact. Trigeminal sensation is intact and the muscles of mastication are normal. The face is symmetric. The palate elevates in the midline. Hearing impaired. Voice is normal. Shoulder shrug is normal. The tongue has normal motion without fasciculations.   Coordination:    No dysmetria or ataxia noted   Gait:    Steppage gait on left  Motor Observation:    No asymmetry, no atrophy, and no involuntary movements noted. Tone:    Normal muscle tone.    Posture:    Posture is slightly stooped    Strength:    Left foot drop     Sensation: intact to LT     Reflex Exam:  DTR's:    Deep tendon reflexes in the upper and lower extremities are symmetrical bilaterally.   Toes:    The toes are equivocal bilaterally.   Clonus:    Clonus is absent.    Assessment/Plan: Patient with Alzheimer's, diagnosed 2017 which was his initial evaluation at [facility], most likely Alzheimer's of moderate severity, he was started on Aricept at that time, Lenox Ponds was added in 2019 he was followed by Dr. Rex Kras in the past who was recently retired, now seeing Dr. Harrington Challenger, diagnosed with late onset Alzheimer's disease without behavioral disturbance he also follows with [place] [facility] and the last  time he was seen surgery was not suggested due to his dementia and surgery could have a significant impact on his  cognitive status.   - diagnosed with late onset alzheimers, wife is his primary caregiver, they still socialize often, there are no behavioral problems, they have a lot of support, still in their house of 36 years, good neighbors, and a wonderful daughter and son. Patient always has a great sense of humor, no behavioral issues,  - Referral to aquatic physical therapy at the new [facility]  - Left foot drop, refer to orthotics  - continue aricept and namenda  - discussedThe "XX BRAIN" Eden Emms with daughter and the [place] This Encounter  Procedures   Ambulatory referral to Physical Therapy   Ambulatory referral for Orthotics   Meds ordered this encounter  Medications   donepezil (ARICEPT) 10 MG tablet    Sig: Take 1 tablet (10 mg total) by mouth 2 (two) times daily.    Dispense:  180 tablet    Refill:  4   memantine (NAMENDA) 10 MG tablet    Sig: Take 1 tablet (10 mg total) by mouth 2 (two) times daily.    Dispense:  180 tablet    Refill:  4     Cc: Hulan Fess, MD,  Lawerance Cruel, MD  Sarina Ill, MD  [facility] [address] [place], [place] [address]  Phone [phone] Fax [phone]  I spent over 60  minutes of face-to-face and non-face-to-face time with patient on the  1. Late onset Alzheimer's dementia without behavioral disturbance ([place])   2. Lumbar degenerative disc disease   3. Chronic bilateral low back pain, unspecified whether sciatica present   4. Gait abnormality   5. Left foot drop   6. At high risk for injury related to fall    diagnosis.  This included previsit chart review, lab review, study review, order entry, electronic health record documentation, patient education on the different diagnostic and therapeutic options, counseling and coordination of care, risks and benefits of management, compliance, or risk factor reduction

## 2021-01-02 ENCOUNTER — Other Ambulatory Visit: Payer: Self-pay | Admitting: Cardiovascular Disease

## 2021-01-05 ENCOUNTER — Encounter: Payer: Self-pay | Admitting: Neurology

## 2021-01-05 DIAGNOSIS — G301 Alzheimer's disease with late onset: Secondary | ICD-10-CM | POA: Insufficient documentation

## 2021-01-05 DIAGNOSIS — M21372 Foot drop, left foot: Secondary | ICD-10-CM | POA: Insufficient documentation

## 2021-01-05 DIAGNOSIS — F0280 Dementia in other diseases classified elsewhere without behavioral disturbance: Secondary | ICD-10-CM | POA: Insufficient documentation

## 2021-01-05 DIAGNOSIS — M5136 Other intervertebral disc degeneration, lumbar region: Secondary | ICD-10-CM | POA: Insufficient documentation

## 2021-01-05 DIAGNOSIS — M51369 Other intervertebral disc degeneration, lumbar region without mention of lumbar back pain or lower extremity pain: Secondary | ICD-10-CM | POA: Insufficient documentation

## 2021-01-05 DIAGNOSIS — R269 Unspecified abnormalities of gait and mobility: Secondary | ICD-10-CM | POA: Insufficient documentation

## 2021-01-05 DIAGNOSIS — M545 Low back pain, unspecified: Secondary | ICD-10-CM | POA: Insufficient documentation

## 2021-01-05 DIAGNOSIS — G8929 Other chronic pain: Secondary | ICD-10-CM | POA: Insufficient documentation

## 2021-01-05 MED ORDER — MEMANTINE HCL 10 MG PO TABS: 10.0000 mg | ORAL_TABLET | Freq: Two times a day (BID) | ORAL | 4 refills | Status: DC

## 2021-01-05 MED ORDER — DONEPEZIL HCL 10 MG PO TABS: 10.0000 mg | ORAL_TABLET | Freq: Two times a day (BID) | ORAL | 4 refills | Status: DC

## 2021-01-06 ENCOUNTER — Telehealth: Payer: Self-pay | Admitting: *Deleted

## 2021-01-06 NOTE — Telephone Encounter (Signed)
Signed order for left orthotic for foot drop faxed to [facility]. Requested they call wife [name] at [phone]. Received a receipt of confirmation.

## 2021-01-08 ENCOUNTER — Encounter (HOSPITAL_BASED_OUTPATIENT_CLINIC_OR_DEPARTMENT_OTHER): Payer: Self-pay | Admitting: Physical Therapy

## 2021-01-08 ENCOUNTER — Other Ambulatory Visit: Payer: Self-pay

## 2021-01-08 ENCOUNTER — Ambulatory Visit (HOSPITAL_BASED_OUTPATIENT_CLINIC_OR_DEPARTMENT_OTHER): Payer: Medicare HMO | Attending: Neurology | Admitting: Physical Therapy

## 2021-01-08 DIAGNOSIS — M6281 Muscle weakness (generalized): Secondary | ICD-10-CM | POA: Diagnosis not present

## 2021-01-08 DIAGNOSIS — R262 Difficulty in walking, not elsewhere classified: Secondary | ICD-10-CM | POA: Insufficient documentation

## 2021-01-08 DIAGNOSIS — R2681 Unsteadiness on feet: Secondary | ICD-10-CM | POA: Insufficient documentation

## 2021-01-08 NOTE — Patient Instructions (Signed)
Access Code: [email or web address] URL: [email or web address] Date: 01/08/2021 Prepared by: Daleen Bo  Exercises Standing March with Counter Support - 2 x daily - 7 x weekly - 2 sets - 10 reps Sit to Stand - 2 x daily - 7 x weekly - 1 sets - 10 reps
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
## 2021-01-13 ENCOUNTER — Telehealth: Payer: Self-pay | Admitting: Neurology

## 2021-01-13 NOTE — Telephone Encounter (Signed)
Received records from [facility] forwarded 41 pages per patient to Dr. Sarina Ill [facility] 9/13/22fbg

## 2021-01-14 ENCOUNTER — Ambulatory Visit (HOSPITAL_BASED_OUTPATIENT_CLINIC_OR_DEPARTMENT_OTHER): Payer: Medicare HMO | Admitting: Physical Therapy

## 2021-01-14 ENCOUNTER — Encounter (HOSPITAL_BASED_OUTPATIENT_CLINIC_OR_DEPARTMENT_OTHER): Payer: Self-pay | Admitting: Physical Therapy

## 2021-01-14 ENCOUNTER — Other Ambulatory Visit: Payer: Self-pay

## 2021-01-14 DIAGNOSIS — M6281 Muscle weakness (generalized): Secondary | ICD-10-CM | POA: Diagnosis not present

## 2021-01-14 DIAGNOSIS — R262 Difficulty in walking, not elsewhere classified: Secondary | ICD-10-CM | POA: Diagnosis not present

## 2021-01-14 DIAGNOSIS — R2681 Unsteadiness on feet: Secondary | ICD-10-CM

## 2021-01-14 NOTE — Therapy (Signed)
[facility] [address] [place], [place], [address] Phone: [phone]   Fax:  [phone]  Physical Therapy Treatment  Patient Details  Name: Alexander [name] MRN: [id number] Date of Birth: 12-11-1940 Referring Provider (PT): Melvenia Beam, MD   Encounter Date: 01/14/2021   PT End of Session - 01/14/21 1107     Visit Number 2    Number of Visits 17    Date for PT Re-Evaluation 04/08/21    Authorization Type [employer]na Medicare    PT Start Time 1100    PT Stop Time 1140    PT Time Calculation (min) 40 min    Equipment Utilized During Treatment Gait belt    Activity Tolerance Patient tolerated treatment well;Patient limited by fatigue    Behavior During Therapy [facility] for tasks assessed/performed             Past Medical History:  Diagnosis Date   Acute inferior myocardial infarction [facility])     Stent to right coronary on Sep 06, 2007 with 3.5 x 20 mm Liberte non-drug eluting stent   Alzheimer disease ([place])    Arthritis    Coronary artery disease    Stent to prox RCA 2009 with cutting balloon to PDA   Dementia ([place])    memory problems   GERD (gastroesophageal reflux disease)    History of tobacco abuse    Hyperlipidemia    Neuromuscular disorder [facility])     Past Surgical History:  Procedure Laterality Date   BACK SURGERY   February 2009   BELPHAROPTOSIS REPAIR     CARDIAC CATHETERIZATION  2009   LUMBAR LAMINECTOMY/DECOMPRESSION MICRODISCECTOMY Left 04/10/2015   Procedure: LUMBAR LAMINECTOMY/DECOMPRESSION MICRODISCECTOMY 1 LEVEL;  Surgeon: Jovita Gamma, MD;  Location: [facility];  Service: Neurosurgery;  Laterality: Left;  Left L45 laminotomy   TONSILLECTOMY      There were no vitals filed for this visit.   Subjective Assessment - 01/14/21 1102     Subjective Pt states he is doing well. Wife reports they are compliant with HEP and pt is doing well with STS exercise and marching.    Patient is accompained  by: Family member   Wife   Pertinent History bilateral neuropathy    Patient Stated Goals Pt states he would like to be able improve mobility, strengthen, and improve balance.    Currently in Pain? No/denies    Multiple Pain Sites No                   [facility] Adult PT Treatment/Exercise - 01/14/21 0001                Ambulation/Gait   Ambulation Distance (Feet) 80 Feet    Assistive device Straight cane    Gait Pattern Decreased stride length;Shuffle;Trunk flexed;Narrow base of support      Posture/Postural Control   Posture/Postural Control Postural limitations    Postural Limitations Rounded Shoulders;Flexed trunk    Posture Comments unsteadiness in standing      Exercises   Exercises Knee/Hip      Knee/Hip Exercises: Standing   Other Standing Knee Exercises standing march at counter 20x each both UE, single UE, on foam, and no UE on flat surface    Other Standing Knee Exercises standing semi tandem 30s each 3x; retro walk 17f and sidestep 259f standing UE reach with NBOS 4x      Knee/Hip Exercises: Seated   Sit to Sand 10 reps   5x5 with  foam                    PT Education - 01/14/21 1106     Education Details diagnosis, exercise progression, HEP    Person(s) Educated Patient    Methods Explanation;Demonstration;Tactile cues;Verbal cues    Comprehension Verbalized understanding;Returned demonstration              PT Short Term Goals - 01/08/21 2039       PT SHORT TERM GOAL #1   Title Pt will become independent with HEP in order to demonstrate synthesis of PT education.    Time 8    Period Weeks    Status New      PT SHORT TERM GOAL #2   Title Pt will be able to demonstrate unsupported standing with NBOS >15s in order to demonstrate functional improvement in static balance activity.    Time 4    Period Weeks    Status New      PT SHORT TERM GOAL #3   Title Pt will be able to reach outside of BOS without UE support in standing in  order to demonstrate functional improvement dynamic weight shifting for simulation of ADL.    Time 4    Period Weeks    Status New               PT Long Term Goals - 01/08/21 2038       PT LONG TERM GOAL #1   Title Pt  will become independent with final HEP in order to demonstrate synthesis of PT education.    Baseline 8    Period Weeks    Status New      PT LONG TERM GOAL #2   Title Pt will have an at least 67% confidence in ABC measure in order to demonstrate improvement above cut off score for high risk of falls.    Baseline 8    Period Weeks    Status New      PT LONG TERM GOAL #3   Title Pt will be able to perform 5XSTS in under 12s  in order to demonstrate functional improvement above the cut off score for adults.    Time 8    Period Weeks    Status New      PT LONG TERM GOAL #4   Title Pt will be able to demonstrate TUG in under 10 sec in order to demonstrate functional improvement in LE function, strength, balance, and mobility for safety with community ambulation.    Time 8    Period Weeks    Status New      PT LONG TERM GOAL #5   Title Pt will be able to demonstrate/report ability to walk >30 mins with SPC and supervision in order to demonstrate functional improvement and tolerance to exercise and community mobility.    Time 8    Period Weeks    Status New                   Plan - 01/14/21 1108     Clinical Impression Statement Pt with good tolerance at today's session to introductory balance exercise. Pt had most difficulty with dynamic gait activity without use of UE. Pt primarily had posterior LOB with static balance activity. Pt able to perform ipsilateral reaching outside of BOS though did require CGA throughout due to tendency towards excessive forward lean. Pt without excessive fatigue during session. Pt likely able to progress  to more dynamic activity at next session. Pt would benefit from continued skilled therapy in order to reach goals  and maximize functional LE strength and balance to prevent falls.    Personal Factors and Comorbidities Age;Comorbidity 1;Comorbidity 2;Time since onset of injury/illness/exacerbation;Behavior Pattern    Examination-Activity Limitations Bathing;Locomotion Level;Transfers;Reach Overhead;Stairs;Squat;Stand;Carry;Other    Examination-Participation Restrictions Other;Community Activity;Interpersonal Relationship;Shop    Stability/Clinical Decision Making Evolving/Moderate complexity    Rehab Potential Fair    PT Frequency 2x / week    PT Duration 8 weeks    PT Treatment/Interventions ADLs/Self Care Home Management;Aquatic Therapy;Electrical Stimulation;Moist Heat;Iontophoresis '4mg'$ /ml Dexamethasone;DME Instruction;Gait training;Stair training;Functional mobility training;Therapeutic activities;Therapeutic exercise;Balance training;Neuromuscular re-education;Patient/family education;Manual techniques;Passive range of motion;Dry needling;Vasopneumatic Device;Joint Manipulations;Spinal Manipulations;Taping;Vestibular;Visual/perceptual remediation/compensation;Traction;Ultrasound    PT Next Visit Plan review HEP, gait mechanics, reaching, review retro step, obstacle navigation, assess AFO if available    PT Home Exercise Plan Access Code: [email or web address]    Consulted and Agree with Plan of Care Patient             Patient will benefit from skilled therapeutic intervention in order to improve the following deficits and impairments:  Abnormal gait, Decreased coordination, Difficulty walking, Postural dysfunction, Decreased endurance, Decreased activity tolerance, Decreased balance, Hypomobility, Decreased knowledge of precautions, Decreased cognition, Decreased mobility, Decreased strength, Impaired perceived functional ability, Improper body mechanics  Visit Diagnosis: Unsteadiness on feet  Muscle weakness (generalized)  Difficulty walking     Problem List Patient Active Problem List   Diagnosis Date  Noted   Lumbar degenerative disc disease 01/05/2021   Chronic bilateral low back pain 01/05/2021   Gait abnormality 01/05/2021   Late onset Alzheimer's dementia without behavioral disturbance ([place]) 01/05/2021   Left foot drop 01/05/2021   HNP (herniated nucleus pulposus), lumbar 04/10/2015   PAD (peripheral artery disease) ([place]) 05/09/2014   CAD (coronary artery disease) 03/03/2011   Hyperlipidemia 03/03/2011    Daleen Bo, PT 01/14/2021, 12:34 PM  [facility] [address] [place], [place], [address] Phone: [phone]   Fax:  [phone]  Name: TILMAN MEINEKE MRN: [id number] Date of Birth: Nov 03, 1940

## 2021-01-15 ENCOUNTER — Telehealth: Payer: Self-pay | Admitting: *Deleted

## 2021-01-15 NOTE — Telephone Encounter (Signed)
R/c medical records from [facility] notes in the pod.

## 2021-01-16 ENCOUNTER — Ambulatory Visit (HOSPITAL_BASED_OUTPATIENT_CLINIC_OR_DEPARTMENT_OTHER): Payer: Medicare HMO | Admitting: Physical Therapy

## 2021-01-19 ENCOUNTER — Ambulatory Visit (HOSPITAL_BASED_OUTPATIENT_CLINIC_OR_DEPARTMENT_OTHER): Payer: Medicare HMO | Admitting: Physical Therapy

## 2021-01-19 ENCOUNTER — Other Ambulatory Visit: Payer: Self-pay

## 2021-01-19 ENCOUNTER — Encounter (HOSPITAL_BASED_OUTPATIENT_CLINIC_OR_DEPARTMENT_OTHER): Payer: Self-pay | Admitting: Physical Therapy

## 2021-01-19 DIAGNOSIS — R262 Difficulty in walking, not elsewhere classified: Secondary | ICD-10-CM | POA: Diagnosis not present

## 2021-01-19 DIAGNOSIS — M6281 Muscle weakness (generalized): Secondary | ICD-10-CM

## 2021-01-19 DIAGNOSIS — R2681 Unsteadiness on feet: Secondary | ICD-10-CM

## 2021-01-19 NOTE — Therapy (Signed)
[facility] [address] [place], [place], [address] Phone: [phone]   Fax:  [phone]  Physical Therapy Treatment  Patient Details  Name: Alexander [name] MRN: [id number] Date of Birth: 09-17-1940 Referring Provider (PT): Melvenia Beam, MD   Encounter Date: 01/19/2021   PT End of Session - 01/19/21 1356     Visit Number 3    Number of Visits 17    Date for PT Re-Evaluation 04/08/21    Authorization Type [employer] Medicare    PT Start Time 1345    PT Stop Time 1425    PT Time Calculation (min) 40 min    Equipment Utilized During Treatment Gait belt    Activity Tolerance Patient tolerated treatment well;Patient limited by fatigue    Behavior During Therapy [facility] for tasks assessed/performed             Past Medical History:  Diagnosis Date   Acute inferior myocardial infarction [facility])     Stent to right coronary on Sep 06, 2007 with 3.5 x 20 mm Liberte non-drug eluting stent   Alzheimer disease ([place])    Arthritis    Coronary artery disease    Stent to prox RCA 2009 with cutting balloon to PDA   Dementia ([place])    memory problems   GERD (gastroesophageal reflux disease)    History of tobacco abuse    Hyperlipidemia    Neuromuscular disorder [facility])     Past Surgical History:  Procedure Laterality Date   BACK SURGERY   February 2009   BELPHAROPTOSIS REPAIR     CARDIAC CATHETERIZATION  2009   LUMBAR LAMINECTOMY/DECOMPRESSION MICRODISCECTOMY Left 04/10/2015   Procedure: LUMBAR LAMINECTOMY/DECOMPRESSION MICRODISCECTOMY 1 LEVEL;  Surgeon: Jovita Gamma, MD;  Location: [facility];  Service: Neurosurgery;  Laterality: Left;  Left L45 laminotomy   TONSILLECTOMY      There were no vitals filed for this visit.                      [place] Adult PT Treatment/Exercise - 01/19/21 0001       Transfers   Five time sit to stand comments  unable to perform without UE, difficulty processing/following  commands      Ambulation/Gait   Ambulation Distance (Feet) 65 Feet    Assistive device Straight cane    Gait Pattern Decreased stride length;Shuffle;Trunk flexed;Narrow base of support      Posture/Postural Control   Posture/Postural Control Postural limitations    Postural Limitations Rounded Shoulders;Flexed trunk    Posture Comments unsteadiness in standing      Exercises   Exercises Knee/Hip      Knee/Hip Exercises: Aerobic   Nustep L3 6 min      Knee/Hip Exercises: Standing   Other Standing Knee Exercises box to taps 20x2; standing cone step over fwd and lateral 4x 6 cones    Other Standing Knee Exercises standing semi tandem 30s each 3x; standing UE reach with NBOS 4x with number sequence      Knee/Hip Exercises: Seated   Sit to Sand 10 reps   5x5 with foam/ cued for hip extension                      PT Short Term Goals - 01/08/21 2039       PT SHORT TERM GOAL #1   Title Pt will become independent with HEP in order to demonstrate synthesis of PT education.  Time 8    Period Weeks    Status New      PT SHORT TERM GOAL #2   Title Pt will be able to demonstrate unsupported standing with NBOS >15s in order to demonstrate functional improvement in static balance activity.    Time 4    Period Weeks    Status New      PT SHORT TERM GOAL #3   Title Pt will be able to reach outside of BOS without UE support in standing in order to demonstrate functional improvement dynamic weight shifting for simulation of ADL.    Time 4    Period Weeks    Status New               PT Long Term Goals - 01/08/21 2038       PT LONG TERM GOAL #1   Title Pt  will become independent with final HEP in order to demonstrate synthesis of PT education.    Baseline 8    Period Weeks    Status New      PT LONG TERM GOAL #2   Title Pt will have an at least 67% confidence in ABC measure in order to demonstrate improvement above cut off score for high risk of falls.     Baseline 8    Period Weeks    Status New      PT LONG TERM GOAL #3   Title Pt will be able to perform 5XSTS in under 12s  in order to demonstrate functional improvement above the cut off score for adults.    Time 8    Period Weeks    Status New      PT LONG TERM GOAL #4   Title Pt will be able to demonstrate TUG in under 10 sec in order to demonstrate functional improvement in LE function, strength, balance, and mobility for safety with community ambulation.    Time 8    Period Weeks    Status New      PT LONG TERM GOAL #5   Title Pt will be able to demonstrate/report ability to walk >30 mins with SPC and supervision in order to demonstrate functional improvement and tolerance to exercise and community mobility.    Time 8    Period Weeks    Status New                   Plan - 01/19/21 1358     Clinical Impression Statement Pt with good tolerance at today's session in terms of strength and endurance. Pt able to perform more single leg biased movements without signficant LOB. Pt with continued postural sway during foot elevated semi tandem balance with minA required for postural correction. Pt with stepping reaction on R LE but delayed response on L. Pt's wife reports getting AFO soon. Pt required significant VC and TC due to difficulty following directions and memory/cognition deficits. Plan to continue with dynamic movements and cognitive challenges. Pt would benefit from continued skilled therapy in order to reach goals and maximize functional LE strength and balance to prevent falls.    Personal Factors and Comorbidities Age;Comorbidity 1;Comorbidity 2;Time since onset of injury/illness/exacerbation;Behavior Pattern    Examination-Activity Limitations Bathing;Locomotion Level;Transfers;Reach Overhead;Stairs;Squat;Stand;Carry;Other    Examination-Participation Restrictions Other;Community Activity;Interpersonal Relationship;Shop    Stability/Clinical Decision Making  Evolving/Moderate complexity    Rehab Potential Fair    PT Frequency 2x / week    PT Duration 8 weeks    PT  Treatment/Interventions ADLs/Self Care Home Management;Aquatic Therapy;Electrical Stimulation;Moist Heat;Iontophoresis '4mg'$ /ml Dexamethasone;DME Instruction;Gait training;Stair training;Functional mobility training;Therapeutic activities;Therapeutic exercise;Balance training;Neuromuscular re-education;Patient/family education;Manual techniques;Passive range of motion;Dry needling;Vasopneumatic Device;Joint Manipulations;Spinal Manipulations;Taping;Vestibular;Visual/perceptual remediation/compensation;Traction;Ultrasound    PT Next Visit Plan review HEP, gait mechanics, sidestepping, assess AFO if available    PT Home Exercise Plan Access Code: [email or web address]    Consulted and Agree with Plan of Care Patient             Patient will benefit from skilled therapeutic intervention in order to improve the following deficits and impairments:  Abnormal gait, Decreased coordination, Difficulty walking, Postural dysfunction, Decreased endurance, Decreased activity tolerance, Decreased balance, Hypomobility, Decreased knowledge of precautions, Decreased cognition, Decreased mobility, Decreased strength, Impaired perceived functional ability, Improper body mechanics  Visit Diagnosis: Unsteadiness on feet  Muscle weakness (generalized)  Difficulty walking     Problem List Patient Active Problem List   Diagnosis Date Noted   Lumbar degenerative disc disease 01/05/2021   Chronic bilateral low back pain 01/05/2021   Gait abnormality 01/05/2021   Late onset Alzheimer's dementia without behavioral disturbance ([place]) 01/05/2021   Left foot drop 01/05/2021   HNP (herniated nucleus pulposus), lumbar 04/10/2015   PAD (peripheral artery disease) ([place]) 05/09/2014   CAD (coronary artery disease) 03/03/2011   Hyperlipidemia 03/03/2011    Daleen Bo, PT 01/19/2021, 8:22 PM  [facility] [address] [place], [place], [address] Phone: [phone]   Fax:  [phone]  Name: Alexander [name] MRN: [id number] Date of Birth: May 12, 1940

## 2021-01-21 ENCOUNTER — Other Ambulatory Visit: Payer: Self-pay

## 2021-01-21 ENCOUNTER — Ambulatory Visit (HOSPITAL_BASED_OUTPATIENT_CLINIC_OR_DEPARTMENT_OTHER): Payer: Medicare HMO | Admitting: Physical Therapy

## 2021-01-21 DIAGNOSIS — R262 Difficulty in walking, not elsewhere classified: Secondary | ICD-10-CM | POA: Diagnosis not present

## 2021-01-21 DIAGNOSIS — M6281 Muscle weakness (generalized): Secondary | ICD-10-CM | POA: Diagnosis not present

## 2021-01-21 DIAGNOSIS — R2681 Unsteadiness on feet: Secondary | ICD-10-CM

## 2021-01-21 NOTE — Therapy (Signed)
[facility] [address] [place], [place], [address] Phone: [phone]   Fax:  [phone]  Physical Therapy Treatment  Patient Details  Name: Alexander [name] MRN: [id number] Date of Birth: [birth year] Referring Provider (PT): Melvenia Beam, MD   Encounter Date: 01/21/2021   PT End of Session - 01/21/21 1113     Visit Number 4    Number of Visits 17    Date for PT Re-Evaluation 04/08/21    Authorization Type [employer]na Medicare    PT Start Time 1113   pt arrives late   PT Stop Time 1145    PT Time Calculation (min) 32 min    Equipment Utilized During Treatment Gait belt    Activity Tolerance Patient tolerated treatment well;Patient limited by fatigue    Behavior During Therapy [facility] for tasks assessed/performed             Past Medical History:  Diagnosis Date   Acute inferior myocardial infarction [facility])     Stent to right coronary on Sep 06, 2007 with 3.5 x 20 mm Liberte non-drug eluting stent   Alzheimer disease ([place])    Arthritis    Coronary artery disease    Stent to prox RCA 2009 with cutting balloon to PDA   Dementia ([place])    memory problems   GERD (gastroesophageal reflux disease)    History of tobacco abuse    Hyperlipidemia    Neuromuscular disorder [facility])     Past Surgical History:  Procedure Laterality Date   BACK SURGERY   February 2009   BELPHAROPTOSIS REPAIR     CARDIAC CATHETERIZATION  2009   LUMBAR LAMINECTOMY/DECOMPRESSION MICRODISCECTOMY Left 04/10/2015   Procedure: LUMBAR LAMINECTOMY/DECOMPRESSION MICRODISCECTOMY 1 LEVEL;  Surgeon: Jovita Gamma, MD;  [facility]: [facility];  Service: Neurosurgery;  Laterality: Left;  Left L45 laminotomy   TONSILLECTOMY      There were no vitals filed for this visit.   Subjective Assessment - 01/21/21 1306     Subjective Pt states he is doing well. They have had no issues with HEP.    Patient is accompained by: Family member   Wife   Pertinent History  bilateral neuropathy    Patient Stated Goals Pt states he would like to be able improve mobility, strengthen, and improve balance.    Currently in Pain? No/denies    Multiple Pain Sites No                   [facility] Adult PT Treatment/Exercise - 01/21/21 0001       Transfers   Five time sit to stand comments  unable to perform without UE      Posture/Postural Control   Posture/Postural Control Postural limitations    Postural Limitations Rounded Shoulders;Flexed trunk    Posture Comments unsteadiness in standing      Exercises   Exercises Knee/Hip      Knee/Hip Exercises: Standing   Other Standing Knee Exercises cone weaving fwd 3x; retro stepping 2x 39ft; standing cone step over fwd and lateral 4x 6 cones; Cone square fwd, lateral, and retro stepping 6x   Other Standing Knee Exercises standing semi tandem 30s each 3x; standing UE reach with NBOS 6x with number sequence double UE and single UE with ball                               PT Short Term Goals -  01/08/21 2039       PT SHORT TERM GOAL #1   Title Pt will become independent with HEP in order to demonstrate synthesis of PT education.    Time 8    Period Weeks    Status New      PT SHORT TERM GOAL #2   Title Pt will be able to demonstrate unsupported standing with NBOS >15s in order to demonstrate functional improvement in static balance activity.    Time 4    Period Weeks    Status New      PT SHORT TERM GOAL #3   Title Pt will be able to reach outside of BOS without UE support in standing in order to demonstrate functional improvement dynamic weight shifting for simulation of ADL.    Time 4    Period Weeks    Status New               PT Long Term Goals - 01/08/21 2038       PT LONG TERM GOAL #1   Title Pt  will become independent with final HEP in order to demonstrate synthesis of PT education.    Baseline 8    Period Weeks    Status New      PT LONG TERM GOAL #2   Title Pt  will have an at least 67% confidence in ABC measure in order to demonstrate improvement above cut off score for high risk of falls.    Baseline 8    Period Weeks    Status New      PT LONG TERM GOAL #3   Title Pt will be able to perform 5XSTS in under 12s  in order to demonstrate functional improvement above the cut off score for adults.    Time 8    Period Weeks    Status New      PT LONG TERM GOAL #4   Title Pt will be able to demonstrate TUG in under 10 sec in order to demonstrate functional improvement in LE function, strength, balance, and mobility for safety with community ambulation.    Time 8    Period Weeks    Status New      PT LONG TERM GOAL #5   Title Pt will be able to demonstrate/report ability to walk >30 mins with SPC and supervision in order to demonstrate functional improvement and tolerance to exercise and community mobility.    Time 8    Period Weeks    Status New                   Plan - 01/21/21 1308     Clinical Impression Statement Pt able to perform more single leg biased movements without signficant LOB today but did have trouble with command following and sequence processing. Pt requires continued VC and TC due to memory/processing deficits. Pt was able to perform more dynamic activity without LOB when prompted. Plan to continue with dynamic movements and cognitive challenges. Pt would benefit from continued skilled therapy in order to reach goals and maximize functional LE strength and balance to prevent falls.    Personal Factors and Comorbidities Age;Comorbidity 1;Comorbidity 2;Time since onset of injury/illness/exacerbation;Behavior Pattern    Examination-Activity Limitations Bathing;Locomotion Level;Transfers;Reach Overhead;Stairs;Squat;Stand;Carry;Other    Examination-Participation Restrictions Other;Community Activity;Interpersonal Relationship;Shop    Stability/Clinical Decision Making Evolving/Moderate complexity    Rehab Potential Fair     PT Frequency 2x / week    PT Duration 8 weeks  PT Treatment/Interventions ADLs/Self Care Home Management;Aquatic Therapy;Electrical Stimulation;Moist Heat;Iontophoresis 4mg /ml Dexamethasone;DME Instruction;Gait training;Stair training;Functional mobility training;Therapeutic activities;Therapeutic exercise;Balance training;Neuromuscular re-education;Patient/family education;Manual techniques;Passive range of motion;Dry needling;Vasopneumatic Device;Joint Manipulations;Spinal Manipulations;Taping;Vestibular;Visual/perceptual remediation/compensation;Traction;Ultrasound    PT Next Visit Plan review HEP, gait mechanics, sidestepping, assess AFO if available    PT Home Exercise Plan Access Code: [email or web address]    Consulted and Agree with Plan of Care Patient             Patient will benefit from skilled therapeutic intervention in order to improve the following deficits and impairments:  Abnormal gait, Decreased coordination, Difficulty walking, Postural dysfunction, Decreased endurance, Decreased activity tolerance, Decreased balance, Hypomobility, Decreased knowledge of precautions, Decreased cognition, Decreased mobility, Decreased strength, Impaired perceived functional ability, Improper body mechanics  Visit Diagnosis: Unsteadiness on feet  Muscle weakness (generalized)  Difficulty walking     Problem List Patient Active Problem List   Diagnosis Date Noted   Lumbar degenerative disc disease 01/05/2021   Chronic bilateral low back pain 01/05/2021   Gait abnormality 01/05/2021   Late onset Alzheimer's dementia without behavioral disturbance ([place]) 01/05/2021   Left foot drop 01/05/2021   HNP (herniated nucleus pulposus), lumbar 04/10/2015   PAD (peripheral artery disease) ([place]) 05/09/2014   CAD (coronary artery disease) 03/03/2011   Hyperlipidemia 03/03/2011    Daleen Bo, PT 01/21/2021, 1:25 PM  [facility] [address] [place], [place], [address] Phone: [phone]   Fax:  [phone]  Name: JEMAL MISKELL MRN: [id number] Date of Birth: 29-Mar-1941

## 2021-01-27 ENCOUNTER — Ambulatory Visit (HOSPITAL_BASED_OUTPATIENT_CLINIC_OR_DEPARTMENT_OTHER): Payer: Medicare HMO | Admitting: Physical Therapy

## 2021-01-27 ENCOUNTER — Encounter (HOSPITAL_BASED_OUTPATIENT_CLINIC_OR_DEPARTMENT_OTHER): Payer: Self-pay | Admitting: Physical Therapy

## 2021-01-27 ENCOUNTER — Other Ambulatory Visit: Payer: Self-pay

## 2021-01-27 DIAGNOSIS — R2681 Unsteadiness on feet: Secondary | ICD-10-CM

## 2021-01-27 DIAGNOSIS — R262 Difficulty in walking, not elsewhere classified: Secondary | ICD-10-CM

## 2021-01-27 DIAGNOSIS — M6281 Muscle weakness (generalized): Secondary | ICD-10-CM

## 2021-01-27 NOTE — Therapy (Signed)
[facility] [address] [place], [place], [address] Phone: [phone]   Fax:  [phone]  Physical Therapy Treatment  Patient Details  Name: Alexander [name] MRN: [id number] Date of Birth: [birth year] Referring Provider (PT): Melvenia Beam, MD   Encounter Date: 01/27/2021   PT End of Session - 01/27/21 1420     Visit Number 5    Number of Visits 17    Date for PT Re-Evaluation 04/08/21    Authorization Type Aetna Medicare    PT Start Time 1400    PT Stop Time 0932    PT Time Calculation (min) 44 min    Equipment Utilized During Treatment Gait belt    Activity Tolerance Patient tolerated treatment well;Patient limited by fatigue    Behavior During Therapy [facility] for tasks assessed/performed             Past Medical History:  Diagnosis Date   Acute inferior myocardial infarction [facility])     Stent to right coronary on Sep 06, 2007 with 3.5 x 20 mm Liberte non-drug eluting stent   Alzheimer disease ([place])    Arthritis    Coronary artery disease    Stent to prox RCA 2009 with cutting balloon to PDA   Dementia ([place])    memory problems   GERD (gastroesophageal reflux disease)    History of tobacco abuse    Hyperlipidemia    Neuromuscular disorder [facility])     Past Surgical History:  Procedure Laterality Date   BACK SURGERY   February 2009   BELPHAROPTOSIS REPAIR     CARDIAC CATHETERIZATION  2009   LUMBAR LAMINECTOMY/DECOMPRESSION MICRODISCECTOMY Left 04/10/2015   Procedure: LUMBAR LAMINECTOMY/DECOMPRESSION MICRODISCECTOMY 1 LEVEL;  Surgeon: Jovita Gamma, MD;  [facility]: [facility];  Service: Neurosurgery;  Laterality: Left;  Left L45 laminotomy   TONSILLECTOMY      There were no vitals filed for this visit.   Subjective Assessment - 01/27/21 1545     Subjective Pt without discomfort, looking forward to getting in pool    Currently in Pain? No/denies                                         PT Education - 01/27/21 1546     Education Details Aquatic;principles of water/benefits    Person(s) Educated Patient;Spouse    Methods Explanation    Comprehension Verbalized understanding              PT Short Term Goals - 01/08/21 2039       PT SHORT TERM GOAL #1   Title Pt will become independent with HEP in order to demonstrate synthesis of PT education.    Time 8    Period Weeks    Status New      PT SHORT TERM GOAL #2   Title Pt will be able to demonstrate unsupported standing with NBOS >15s in order to demonstrate functional improvement in static balance activity.    Time 4    Period Weeks    Status New      PT SHORT TERM GOAL #3   Title Pt will be able to reach outside of BOS without UE support in standing in order to demonstrate functional improvement dynamic weight shifting for simulation of ADL.    Time 4    Period Weeks    Status New  PT Long Term Goals - 01/08/21 2038       PT LONG TERM GOAL #1   Title Pt  will become independent with final HEP in order to demonstrate synthesis of PT education.    Baseline 8    Period Weeks    Status New      PT LONG TERM GOAL #2   Title Pt will have an at least 67% confidence in ABC measure in order to demonstrate improvement above cut off score for high risk of falls.    Baseline 8    Period Weeks    Status New      PT LONG TERM GOAL #3   Title Pt will be able to perform 5XSTS in under 12s  in order to demonstrate functional improvement above the cut off score for adults.    Time 8    Period Weeks    Status New      PT LONG TERM GOAL #4   Title Pt will be able to demonstrate TUG in under 10 sec in order to demonstrate functional improvement in LE function, strength, balance, and mobility for safety with community ambulation.    Time 8    Period Weeks    Status New      PT LONG TERM GOAL #5   Title Pt will be able to  demonstrate/report ability to walk >30 mins with SPC and supervision in order to demonstrate functional improvement and tolerance to exercise and community mobility.    Time 8    Period Weeks    Status New            Pt seen for aquatic therapy today.  Treatment took place in water 3.25-4.8 ft in depth at the [employer] pool. Temp of water was 91.  Pt entered/exited the pool via stairs step to pattern with bilat rail and CGA  Introduction to aquatic setting.  Water walking using yellow noodle throughout pool/varying depths  Warm up: forward, backward and side stepping/walking cues for increased step length x 2 lengths and 4 widths.  Progressed to no UE support.  Seated Stretching: -hamstring and gastroc with manual assist 4 x 20 second hold Strengthening: -Flutter kick 3 x 20. Cues for tightening abdominals but pt unable to complete or maintain. Difficulty maintaining sitting balance in position Balance : -Sit to stand from bench. VC and demonstration for proper execution and gaining immediate standing balance. X10.    Standing Strengthening: -Marching 2 x 10 reps -Hip flex x10 -Hip ext and add/abd x10 -UE with 1 foam then progressed to 2 foam buoys: shld flex x 10, ext x10, add/bd x 10, horizonal abd/dd x 10 Balance challenges:  -Standing balance decreasing BOS x 3 holding x up to 30seconds. Pt using ue in water to Clifford position.  Unable to hold with small BOS but for 10 seconds after several tries. -Unilateral stance/SLS: after several tries pt best sanding on rle x 5 seconds  Pt requires buoyancy for support and to offload joints with strengthening exercises. Viscosity of the water is needed for resistance of strengthening; water current perturbations provides challenge to standing balance unsupported, requiring increased core activation.        Plan - 01/27/21 1547     Clinical Impression Statement Pt introduced to aquatic session. He is without  apprehension.  Water walking throughout varying depths.  Pt controlling body well initially with yellow noodle progressing to without UE support. Instructed pt through balance challenges as in on  land therapy decreasing BOS adding SLS. Pt tends to lose balance backward.  Had difficulty with sit to stand from water bench gaining immediate standing balance. Some perturbations as tolerated.  Pt following verbal instructions 90% of time. Was able to engage pt for entire session with 2 seated recovery periods effectively impacting endurance and aerobic capacity.    Personal Factors and Comorbidities Age    Examination-Activity Limitations Bathing;Locomotion Level;Transfers;Reach Overhead;Stairs;Squat;Stand;Carry;Other    Stability/Clinical Decision Making Evolving/Moderate complexity    Clinical Decision Making Moderate    Rehab Potential Fair    PT Frequency 2x / week    PT Duration 8 weeks    PT Treatment/Interventions ADLs/Self Care Home Management;Aquatic Therapy;Electrical Stimulation;Moist Heat;Iontophoresis 4mg /ml Dexamethasone;DME Instruction;Gait training;Stair training;Functional mobility training;Therapeutic activities;Therapeutic exercise;Balance training;Neuromuscular re-education;Patient/family education;Manual techniques;Passive range of motion;Dry needling;Vasopneumatic Device;Joint Manipulations;Spinal Manipulations;Taping;Vestibular;Visual/perceptual remediation/compensation;Traction;Ultrasound             Patient will benefit from skilled therapeutic intervention in order to improve the following deficits and impairments:  Abnormal gait, Decreased coordination, Difficulty walking, Postural dysfunction, Decreased endurance, Decreased activity tolerance, Decreased balance, Hypomobility, Decreased knowledge of precautions, Decreased cognition, Decreased mobility, Decreased strength, Impaired perceived functional ability, Improper body mechanics  Visit Diagnosis: Unsteadiness on  feet  Muscle weakness (generalized)  Difficulty walking     Problem List Patient Active Problem List   Diagnosis Date Noted   Lumbar degenerative disc disease 01/05/2021   Chronic bilateral low back pain 01/05/2021   Gait abnormality 01/05/2021   Late onset Alzheimer's dementia without behavioral disturbance ([place]) 01/05/2021   Left foot drop 01/05/2021   HNP (herniated nucleus pulposus), lumbar 04/10/2015   PAD (peripheral artery disease) ([place]) 05/09/2014   CAD (coronary artery disease) 03/03/2011   Hyperlipidemia 03/03/2011    Annamarie Major) Destini Cambre MPT  01/27/2021, 5:45 PM  [facility] [address] [place], [place], [address] Phone: [phone]   Fax:  [phone]  Name: Alexander [name] MRN: [id number] Date of Birth: 05-12-1940

## 2021-01-29 ENCOUNTER — Ambulatory Visit (HOSPITAL_BASED_OUTPATIENT_CLINIC_OR_DEPARTMENT_OTHER): Payer: Medicare HMO | Admitting: Physical Therapy

## 2021-01-29 ENCOUNTER — Encounter (HOSPITAL_BASED_OUTPATIENT_CLINIC_OR_DEPARTMENT_OTHER): Payer: Self-pay | Admitting: Physical Therapy

## 2021-01-29 ENCOUNTER — Other Ambulatory Visit: Payer: Self-pay

## 2021-01-29 DIAGNOSIS — R2681 Unsteadiness on feet: Secondary | ICD-10-CM | POA: Diagnosis not present

## 2021-01-29 DIAGNOSIS — M6281 Muscle weakness (generalized): Secondary | ICD-10-CM

## 2021-01-29 DIAGNOSIS — R262 Difficulty in walking, not elsewhere classified: Secondary | ICD-10-CM | POA: Diagnosis not present

## 2021-01-29 NOTE — Therapy (Signed)
[facility] [address] [place], [place], [address] Phone: [phone]   Fax:  [phone]  Physical Therapy Treatment  Patient Details  Name: Alexander [name] MRN: [id number] Date of Birth: 1940-11-16 Referring Provider (PT): Melvenia Beam, MD   Encounter Date: 01/29/2021   PT End of Session - 01/29/21 1054     Visit Number 6    Number of Visits 17    Date for PT Re-Evaluation 04/08/21    Authorization Type [employer]    PT Start Time 1100    PT Stop Time 1140    PT Time Calculation (min) 40 min    Equipment Utilized During Treatment Gait belt    Activity Tolerance Patient tolerated treatment well;Patient limited by fatigue    Behavior During Therapy [facility] for tasks assessed/performed             Past Medical History:  Diagnosis Date   Acute inferior myocardial infarction [facility])     Stent to right coronary on Sep 06, 2007 with 3.5 x 20 mm Liberte non-drug eluting stent   Alzheimer disease ([place])    Arthritis    Coronary artery disease    Stent to prox RCA 2009 with cutting balloon to PDA   Dementia ([place])    memory problems   GERD (gastroesophageal reflux disease)    History of tobacco abuse    Hyperlipidemia    Neuromuscular disorder [facility])     Past Surgical History:  Procedure Laterality Date   BACK SURGERY   February 2009   BELPHAROPTOSIS REPAIR     CARDIAC CATHETERIZATION  2009   LUMBAR LAMINECTOMY/DECOMPRESSION MICRODISCECTOMY Left 04/10/2015   Procedure: LUMBAR LAMINECTOMY/DECOMPRESSION MICRODISCECTOMY 1 LEVEL;  Surgeon: Jovita Gamma, MD;  Location: [facility];  Service: Neurosurgery;  Laterality: Left;  Left L45 laminotomy   TONSILLECTOMY      There were no vitals filed for this visit.   Subjective Assessment - 01/29/21 1101     Subjective Pt states he enjoyed the pool and had no pain after last session.    Currently in Pain? No/denies    Multiple Pain Sites No                Pt seen for aquatic therapy today.  Treatment took place in water 3.25-4.8 ft in depth at the [facility] pool. Temp of water was 94.  Pt entered/exited the pool via stairs step to pattern with bilat rail and CGA      Warm up: forward, backward and side stepping/walking cues for increased step length 3x length, No UE   Seated Stretching: -hamstring 30s 2x gastroc with manual assist 30s 2x second on L Attempted L ankle DF PNF, 0/5 tib ant response  Strengthening: -Flutter kick 3 x 20. Cues for upright position  Balance : -Sit to stand from bench. Continued demonstration needed 2x10.     Standing: -Marching 2 x 10 reps -Hip flex x10 -Hip ext and add/abd x10 with UE DB support, double and then to single   Balance challenges:  -Standing semi-tandem 2x 30s,  -Unilateral stance/SLS: attempted multiple times without success   Pt requires buoyancy for support and to offload joints with strengthening exercises. Viscosity of the water is needed for resistance of strengthening; water current perturbations provides challenge to standing balance unsupported, requiring increased core activation.                    PT Education - 01/29/21 1249  Education Details review of aquatic principles, exercise progression, DOMS expectations, recovery    Person(s) Educated Patient;Spouse    Methods Explanation;Verbal cues;Demonstration    Comprehension Verbalized understanding              PT Short Term Goals - 01/08/21 2039       PT SHORT TERM GOAL #1   Title Pt will become independent with HEP in order to demonstrate synthesis of PT education.    Time 8    Period Weeks    Status New      PT SHORT TERM GOAL #2   Title Pt will be able to demonstrate unsupported standing with NBOS >15s in order to demonstrate functional improvement in static balance activity.    Time 4    Period Weeks    Status New      PT SHORT TERM GOAL #3   Title Pt will be  able to reach outside of BOS without UE support in standing in order to demonstrate functional improvement dynamic weight shifting for simulation of ADL.    Time 4    Period Weeks    Status New               PT Long Term Goals - 01/08/21 2038       PT LONG TERM GOAL #1   Title Pt  will become independent with final HEP in order to demonstrate synthesis of PT education.    Baseline 8    Period Weeks    Status New      PT LONG TERM GOAL #2   Title Pt will have an at least 67% confidence in ABC measure in order to demonstrate improvement above cut off score for high risk of falls.    Baseline 8    Period Weeks    Status New      PT LONG TERM GOAL #3   Title Pt will be able to perform 5XSTS in under 12s  in order to demonstrate functional improvement above the cut off score for adults.    Time 8    Period Weeks    Status New      PT LONG TERM GOAL #4   Title Pt will be able to demonstrate TUG in under 10 sec in order to demonstrate functional improvement in LE function, strength, balance, and mobility for safety with community ambulation.    Time 8    Period Weeks    Status New      PT LONG TERM GOAL #5   Title Pt will be able to demonstrate/report ability to walk >30 mins with SPC and supervision in order to demonstrate functional improvement and tolerance to exercise and community mobility.    Time 8    Period Weeks    Status New                   Plan - 01/29/21 1250     Clinical Impression Statement Pt tolerated continued aquatic therapy session well with ability to advance standing exercises to reduced/intermittent UE support. Pt still with posterior leaning preference during dynamic activity but does well with visual cuing for positioning. Pt requires verbal prompting or demonstration to continue with repetitions during exercise. Pt also with difficulty understanding instructions during tandem walking exercise. L PNF ankle DF trialed in pool without  success. Plan to continue with dynamic standing and seated balance challenges. Pt would benefit from continued skilled therapy in order to reach goals and maximize  functional LE strength and balance to prevent falls.    Personal Factors and Comorbidities Age    Examination-Activity Limitations Bathing;Locomotion Level;Transfers;Reach Overhead;Stairs;Squat;Stand;Carry;Other    Stability/Clinical Decision Making Evolving/Moderate complexity    Rehab Potential Fair    PT Frequency 2x / week    PT Duration 8 weeks    PT Treatment/Interventions ADLs/Self Care Home Management;Aquatic Therapy;Electrical Stimulation;Moist Heat;Iontophoresis 4mg /ml Dexamethasone;DME Instruction;Gait training;Stair training;Functional mobility training;Therapeutic activities;Therapeutic exercise;Balance training;Neuromuscular re-education;Patient/family education;Manual techniques;Passive range of motion;Dry needling;Vasopneumatic Device;Joint Manipulations;Spinal Manipulations;Taping;Vestibular;Visual/perceptual remediation/compensation;Traction;Ultrasound             Patient will benefit from skilled therapeutic intervention in order to improve the following deficits and impairments:  Abnormal gait, Decreased coordination, Difficulty walking, Postural dysfunction, Decreased endurance, Decreased activity tolerance, Decreased balance, Hypomobility, Decreased knowledge of precautions, Decreased cognition, Decreased mobility, Decreased strength, Impaired perceived functional ability, Improper body mechanics  Visit Diagnosis: Muscle weakness (generalized)  Unsteadiness on feet  Difficulty walking     Problem List Patient Active Problem List   Diagnosis Date Noted   Lumbar degenerative disc disease 01/05/2021   Chronic bilateral low back pain 01/05/2021   Gait abnormality 01/05/2021   Late onset Alzheimer's dementia without behavioral disturbance ([place]) 01/05/2021   Left foot drop 01/05/2021   HNP (herniated  nucleus pulposus), lumbar 04/10/2015   PAD (peripheral artery disease) ([place]) 05/09/2014   CAD (coronary artery disease) 03/03/2011   Hyperlipidemia 03/03/2011    Daleen Bo, PT 01/29/2021, 12:56 PM  [facility] [address] [place], [place], [address] Phone: [phone]   Fax:  [phone]  Name: Alexander [name] MRN: [id number] Date of Birth: 1941-01-18

## 2021-02-02 ENCOUNTER — Ambulatory Visit (HOSPITAL_BASED_OUTPATIENT_CLINIC_OR_DEPARTMENT_OTHER): Payer: Medicare HMO | Attending: Neurology | Admitting: Physical Therapy

## 2021-02-02 ENCOUNTER — Other Ambulatory Visit: Payer: Self-pay

## 2021-02-02 ENCOUNTER — Encounter (HOSPITAL_BASED_OUTPATIENT_CLINIC_OR_DEPARTMENT_OTHER): Payer: Self-pay | Admitting: Physical Therapy

## 2021-02-02 DIAGNOSIS — R262 Difficulty in walking, not elsewhere classified: Secondary | ICD-10-CM | POA: Diagnosis not present

## 2021-02-02 DIAGNOSIS — M6281 Muscle weakness (generalized): Secondary | ICD-10-CM | POA: Diagnosis not present

## 2021-02-02 DIAGNOSIS — R2681 Unsteadiness on feet: Secondary | ICD-10-CM | POA: Diagnosis not present

## 2021-02-02 NOTE — Therapy (Signed)
[facility] [address] [place], [place], [address] Phone: [phone]   Fax:  [phone]  Physical Therapy Treatment  Patient Details  Name: Alexander [name] MRN: [id number] Date of Birth: 13-Oct-1940 Referring Provider (PT): Melvenia Beam, MD   Encounter Date: 02/02/2021   PT End of Session - 02/02/21 1035     Visit Number 7    Number of Visits 17    Date for PT Re-Evaluation 04/08/21    Authorization Type [employer] Medicare    PT Start Time 1100    PT Stop Time 1140    PT Time Calculation (min) 40 min    Equipment Utilized During Treatment Gait belt    Activity Tolerance Patient tolerated treatment well    Behavior During Therapy [facility] for tasks assessed/performed             Past Medical History:  Diagnosis Date   Acute inferior myocardial infarction [facility])     Stent to right coronary on Sep 06, 2007 with 3.5 x 20 mm Liberte non-drug eluting stent   Alzheimer disease ([place])    Arthritis    Coronary artery disease    Stent to prox RCA 2009 with cutting balloon to PDA   Dementia ([place])    memory problems   GERD (gastroesophageal reflux disease)    History of tobacco abuse    Hyperlipidemia    Neuromuscular disorder [facility])     Past Surgical History:  Procedure Laterality Date   BACK SURGERY   February 2009   BELPHAROPTOSIS REPAIR     CARDIAC CATHETERIZATION  2009   LUMBAR LAMINECTOMY/DECOMPRESSION MICRODISCECTOMY Left 04/10/2015   Procedure: LUMBAR LAMINECTOMY/DECOMPRESSION MICRODISCECTOMY 1 LEVEL;  Surgeon: Jovita Gamma, MD;  Location: [facility];  Service: Neurosurgery;  Laterality: Left;  Left L45 laminotomy   TONSILLECTOMY      There were no vitals filed for this visit.   Subjective Assessment - 02/02/21 1036     Subjective Pt states he did not have pain or extra soreness from last session. No falls.    Patient is accompained by: Family member    Pertinent History bilateral neuropathy    Patient  Stated Goals Pt states he would like to be able improve mobility, strengthen, and improve balance.    Currently in Pain? No/denies    Multiple Pain Sites No                 Pt seen for aquatic therapy today.  Treatment took place in water 3.25-4.8 ft in depth at the [facility] pool. Temp of water was 94.  Pt entered/exited the pool via alternating with bilat rail and SBA       Warm up: marching forward and side stepping,  No UE   Seated   Strengthening: -Flutter kick 3 x 20. Cues for upright position    Standing: -Marching 2 x 10 reps -Hip flex x10 -step ups 2x10 each leg -Sit to stand from bench with power concentric 5lb weight. Continued demonstration needed 2x10.   -standing NBOS DB and board push down 20x each -NBOS EC standing balance 20s 4x - resisted retro walking 4x      Balance challenges:  -Standing semi-tandem 2x 30s,  -Unilateral stance/SLS: 15s each LE with double UE support   Pt requires buoyancy for support and to offload joints with strengthening exercises. Viscosity of the water is needed for resistance of strengthening; water current perturbations provides challenge to standing balance unsupported, requiring increased  core activation.           PT Education - 02/02/21 1036     Education Details exercise progression, HEP    Person(s) Educated Patient;Spouse    Methods Demonstration;Explanation;Verbal cues    Comprehension Verbalized understanding;Returned demonstration              PT Short Term Goals - 01/08/21 2039       PT SHORT TERM GOAL #1   Title Pt will become independent with HEP in order to demonstrate synthesis of PT education.    Time 8    Period Weeks    Status New      PT SHORT TERM GOAL #2   Title Pt will be able to demonstrate unsupported standing with NBOS >15s in order to demonstrate functional improvement in static balance activity.    Time 4    Period Weeks    Status New      PT SHORT TERM  GOAL #3   Title Pt will be able to reach outside of BOS without UE support in standing in order to demonstrate functional improvement dynamic weight shifting for simulation of ADL.    Time 4    Period Weeks    Status New               PT Long Term Goals - 01/08/21 2038       PT LONG TERM GOAL #1   Title Pt  will become independent with final HEP in order to demonstrate synthesis of PT education.    Baseline 8    Period Weeks    Status New      PT LONG TERM GOAL #2   Title Pt will have an at least 67% confidence in ABC measure in order to demonstrate improvement above cut off score for high risk of falls.    Baseline 8    Period Weeks    Status New      PT LONG TERM GOAL #3   Title Pt will be able to perform 5XSTS in under 12s  in order to demonstrate functional improvement above the cut off score for adults.    Time 8    Period Weeks    Status New      PT LONG TERM GOAL #4   Title Pt will be able to demonstrate TUG in under 10 sec in order to demonstrate functional improvement in LE function, strength, balance, and mobility for safety with community ambulation.    Time 8    Period Weeks    Status New      PT LONG TERM GOAL #5   Title Pt will be able to demonstrate/report ability to walk >30 mins with SPC and supervision in order to demonstrate functional improvement and tolerance to exercise and community mobility.    Time 8    Period Weeks    Status New                   Plan - 02/02/21 1037     Clinical Impression Statement Pt able to progress well with balance activity though continues to required continued VC and TC during exercise in order to begin/sustain correct technique during session. Pt found most dificulty with dynamic walking activity with narrowed BOS. Pt only able to get into semi-tandem position. However, SLS was improved to 15s per LE with floatation support when in 25% BW. Plan to revisit previously performed exercises in order to faciliate  repeated motor learning  and planning given current cognition. Likely unable to perform exercise efficiently with random practice. Pt would benefit from continued skilled therapy in order to reach goals and maximize functional LE strength and balance to prevent falls.    Personal Factors and Comorbidities Age    Examination-Activity Limitations Bathing;Locomotion Level;Transfers;Reach Overhead;Stairs;Squat;Stand;Carry;Other    Stability/Clinical Decision Making Evolving/Moderate complexity    Rehab Potential Fair    PT Frequency 2x / week    PT Duration 8 weeks    PT Treatment/Interventions ADLs/Self Care Home Management;Aquatic Therapy;Electrical Stimulation;Moist Heat;Iontophoresis 4mg /ml Dexamethasone;DME Instruction;Gait training;Stair training;Functional mobility training;Therapeutic activities;Therapeutic exercise;Balance training;Neuromuscular re-education;Patient/family education;Manual techniques;Passive range of motion;Dry needling;Vasopneumatic Device;Joint Manipulations;Spinal Manipulations;Taping;Vestibular;Visual/perceptual remediation/compensation;Traction;Ultrasound    PT Home Exercise Plan Access Code: [email or web address]    Consulted and Agree with Plan of Care Patient;Family member/caregiver    Family Member Consulted wife             Patient will benefit from skilled therapeutic intervention in order to improve the following deficits and impairments:  Abnormal gait, Decreased coordination, Difficulty walking, Postural dysfunction, Decreased endurance, Decreased activity tolerance, Decreased balance, Hypomobility, Decreased knowledge of precautions, Decreased cognition, Decreased mobility, Decreased strength, Impaired perceived functional ability, Improper body mechanics  Visit Diagnosis: Muscle weakness (generalized)  Unsteadiness on feet  Difficulty walking     Problem List Patient Active Problem List   Diagnosis Date Noted   Lumbar degenerative disc disease 01/05/2021    Chronic bilateral low back pain 01/05/2021   Gait abnormality 01/05/2021   Late onset Alzheimer's dementia without behavioral disturbance ([place]) 01/05/2021   Left foot drop 01/05/2021   HNP (herniated nucleus pulposus), lumbar 04/10/2015   PAD (peripheral artery disease) ([place]) 05/09/2014   CAD (coronary artery disease) 03/03/2011   Hyperlipidemia 03/03/2011    Daleen Bo, PT 02/02/2021, 1:26 PM  [facility] [place], [place], [address] Phone: [phone]   Fax:  [phone]  Name: Alexander [name] MRN: [id number] Date of Birth: Dec 21, 1940

## 2021-02-05 ENCOUNTER — Encounter (HOSPITAL_BASED_OUTPATIENT_CLINIC_OR_DEPARTMENT_OTHER): Payer: Self-pay | Admitting: Physical Therapy

## 2021-02-05 ENCOUNTER — Other Ambulatory Visit: Payer: Self-pay

## 2021-02-05 ENCOUNTER — Ambulatory Visit (HOSPITAL_BASED_OUTPATIENT_CLINIC_OR_DEPARTMENT_OTHER): Payer: Medicare HMO | Admitting: Physical Therapy

## 2021-02-05 DIAGNOSIS — R262 Difficulty in walking, not elsewhere classified: Secondary | ICD-10-CM | POA: Diagnosis not present

## 2021-02-05 DIAGNOSIS — M6281 Muscle weakness (generalized): Secondary | ICD-10-CM | POA: Diagnosis not present

## 2021-02-05 DIAGNOSIS — R2681 Unsteadiness on feet: Secondary | ICD-10-CM | POA: Diagnosis not present

## 2021-02-05 NOTE — Therapy (Addendum)
[place], [place], [address] Phone: [phone]   Fax:  [phone]  Physical Therapy Treatment  PHYSICAL THERAPY DISCHARGE SUMMARY  Visits from Start of Care: 8   Plan: Patient agrees to discharge.  Patient goals were not met. Patient is being discharged due to not returning to therapy.      Patient Details  Name: Alexander [name] MRN: [id number] Date of Birth: 10/06/1940 Referring Provider (PT): Melvenia Beam, MD   Encounter Date: 02/05/2021   PT End of Session - 02/05/21 1041     Visit Number 8    Number of Visits 17    Date for PT Re-Evaluation 04/08/21    Authorization Type [employer]    PT Start Time 1040    PT Stop Time 1120    PT Time Calculation (min) 40 min    Equipment Utilized During Treatment Gait belt    Activity Tolerance Patient tolerated treatment well    Behavior During Therapy [facility] for tasks assessed/performed             Past Medical History:  Diagnosis Date   Acute inferior myocardial infarction ([place])     Stent to right coronary on Sep 06, 2007 with 3.5 x 20 mm Liberte non-drug eluting stent   Alzheimer disease ([place])    Arthritis    Coronary artery disease    Stent to prox RCA 2009 with cutting balloon to PDA   Dementia ([place])    memory problems   GERD (gastroesophageal reflux disease)    History of tobacco abuse    Hyperlipidemia    Neuromuscular disorder [facility])     Past Surgical History:  Procedure Laterality Date   BACK SURGERY   February 2009   BELPHAROPTOSIS REPAIR     CARDIAC CATHETERIZATION  2009   LUMBAR LAMINECTOMY/DECOMPRESSION MICRODISCECTOMY Left 04/10/2015   Procedure: LUMBAR LAMINECTOMY/DECOMPRESSION MICRODISCECTOMY 1 LEVEL;  Surgeon: Jovita Gamma, MD;  Location: [facility];  Service: Neurosurgery;  Laterality: Left;  Left L45 laminotomy   TONSILLECTOMY      There were no vitals filed for this visit.   Subjective Assessment -  02/05/21 1042     Subjective Pt states he is doing well today.    Patient is accompained by: Family member    Pertinent History bilateral neuropathy    Patient Stated Goals Pt states he would like to be able improve mobility, strengthen, and improve balance.               Pt seen for aquatic therapy today.  Treatment took place in water 3.25-4.8 ft in depth at the [employer] pool. Temp of water was 94.  Pt entered/exited the pool via alternating with bilat rail and SBA       Warm up: marching forward, retro, and side stepping,  No UE   Seated  -Flutter kick 3 x 20s -STS 5lb 2x10   Standing: -pool length walking No UE 2x -Marching 2 x 10 reps -Hip flex x10 -step ups 2x10 each leg -minisquat with power concentric 5lb weight. Continued demonstration needed 2x10.   -standing board press/row and rotation 20x each -Semi-tandem EC standing balance 20s 4x with DB support - resisted retro and fwd walking 4x       Balance challenges:  -Standing semi-tandem 2x 30s,  -Unilateral stance/SLS: 15s each LE with double UE support   Pt requires buoyancy for support and to offload joints with strengthening exercises. Viscosity of the  water is needed for resistance of strengthening; water current perturbations provides challenge to standing balance unsupported, requiring increased core activation.                  PT Short Term Goals - 01/08/21 2039       PT SHORT TERM GOAL #1   Title Pt will become independent with HEP in order to demonstrate synthesis of PT education.    Time 8    Period Weeks    Status New      PT SHORT TERM GOAL #2   Title Pt will be able to demonstrate unsupported standing with NBOS >15s in order to demonstrate functional improvement in static balance activity.    Time 4    Period Weeks    Status New      PT SHORT TERM GOAL #3   Title Pt will be able to reach outside of BOS without UE support in standing in order to demonstrate  functional improvement dynamic weight shifting for simulation of ADL.    Time 4    Period Weeks    Status New               PT Long Term Goals - 01/08/21 2038       PT LONG TERM GOAL #1   Title Pt  will become independent with final HEP in order to demonstrate synthesis of PT education.    Baseline 8    Period Weeks    Status New      PT LONG TERM GOAL #2   Title Pt will have an at least 67% confidence in ABC measure in order to demonstrate improvement above cut off score for high risk of falls.    Baseline 8    Period Weeks    Status New      PT LONG TERM GOAL #3   Title Pt will be able to perform 5XSTS in under 12s  in order to demonstrate functional improvement above the cut off score for adults.    Time 8    Period Weeks    Status New      PT LONG TERM GOAL #4   Title Pt will be able to demonstrate TUG in under 10 sec in order to demonstrate functional improvement in LE function, strength, balance, and mobility for safety with community ambulation.    Time 8    Period Weeks    Status New      PT LONG TERM GOAL #5   Title Pt will be able to demonstrate/report ability to walk >30 mins with SPC and supervision in order to demonstrate functional improvement and tolerance to exercise and community mobility.    Time 8    Period Weeks    Status New                   Plan - 02/05/21 1042     Clinical Impression Statement Pt with good progression of balance exercis at today's session. Pt required 2x seated rest breaks due to increased fatigue with session. Pt required less VC today with repeated exercise and minor modifications. Less UE needed though does have most difficulty with tandem on NBOS exercise. Pt case will be put on hold due to recent house damage/wife scheduling conflicts. Pt progressing very well with therpay and likely to continue to make improvements. Pt will continue with HEP and plan to return and continue as able.   Pt would benefit from  continued skilled  therapy in order to reach goals and maximize functional LE strength and balance to prevent falls.    Personal Factors and Comorbidities Age    Examination-Activity Limitations Bathing;Locomotion Level;Transfers;Reach Overhead;Stairs;Squat;Stand;Carry;Other    Stability/Clinical Decision Making Evolving/Moderate complexity    Rehab Potential Fair    PT Frequency 2x / week    PT Duration 8 weeks    PT Treatment/Interventions ADLs/Self Care Home Management;Aquatic Therapy;Electrical Stimulation;Moist Heat;Iontophoresis 33m/ml Dexamethasone;DME Instruction;Gait training;Stair training;Functional mobility training;Therapeutic activities;Therapeutic exercise;Balance training;Neuromuscular re-education;Patient/family education;Manual techniques;Passive range of motion;Dry needling;Vasopneumatic Device;Joint Manipulations;Spinal Manipulations;Taping;Vestibular;Visual/perceptual remediation/compensation;Traction;Ultrasound    PT Home Exercise Plan Access Code: [email or web address]    Consulted and Agree with Plan of Care Patient;Family member/caregiver    Family Member Consulted wife             Patient will benefit from skilled therapeutic intervention in order to improve the following deficits and impairments:  Abnormal gait, Decreased coordination, Difficulty walking, Postural dysfunction, Decreased endurance, Decreased activity tolerance, Decreased balance, Hypomobility, Decreased knowledge of precautions, Decreased cognition, Decreased mobility, Decreased strength, Impaired perceived functional ability, Improper body mechanics  Visit Diagnosis: Muscle weakness (generalized)  Unsteadiness on feet  Difficulty walking     Problem List Patient Active Problem List   Diagnosis Date Noted   Lumbar degenerative disc disease 01/05/2021   Chronic bilateral low back pain 01/05/2021   Gait abnormality 01/05/2021   Late onset Alzheimer's dementia without behavioral disturbance [place]  01/05/2021   Left foot drop 01/05/2021   HNP (herniated nucleus pulposus), lumbar 04/10/2015   PAD (peripheral artery disease) ([place] 05/09/2014   CAD (coronary artery disease) 03/03/2011   Hyperlipidemia 03/03/2011    ADaleen Bo PT 02/05/2021, 12:59 PM  [facility] [address] [address]Phone: [phone]  Fax:  [phone] Name: RNAHSIR VENEZIAMRN: [id number] of Birth: [birth year]

## 2021-03-02 DIAGNOSIS — H26491 Other secondary cataract, right eye: Secondary | ICD-10-CM | POA: Diagnosis not present

## 2021-03-02 DIAGNOSIS — H35373 Puckering of macula, bilateral: Secondary | ICD-10-CM | POA: Diagnosis not present

## 2021-03-02 DIAGNOSIS — H52203 Unspecified astigmatism, bilateral: Secondary | ICD-10-CM | POA: Diagnosis not present

## 2021-03-02 DIAGNOSIS — Z961 Presence of intraocular lens: Secondary | ICD-10-CM | POA: Diagnosis not present

## 2021-05-14 DIAGNOSIS — I10 Essential (primary) hypertension: Secondary | ICD-10-CM | POA: Diagnosis not present

## 2021-05-21 DIAGNOSIS — N1831 Chronic kidney disease, stage 3a: Secondary | ICD-10-CM | POA: Diagnosis not present

## 2021-05-21 DIAGNOSIS — I739 Peripheral vascular disease, unspecified: Secondary | ICD-10-CM | POA: Diagnosis not present

## 2021-05-21 DIAGNOSIS — Z0000 Encounter for general adult medical examination without abnormal findings: Secondary | ICD-10-CM | POA: Diagnosis not present

## 2021-05-21 DIAGNOSIS — K219 Gastro-esophageal reflux disease without esophagitis: Secondary | ICD-10-CM | POA: Diagnosis not present

## 2021-05-21 DIAGNOSIS — E7800 Pure hypercholesterolemia, unspecified: Secondary | ICD-10-CM | POA: Diagnosis not present

## 2021-05-21 DIAGNOSIS — M549 Dorsalgia, unspecified: Secondary | ICD-10-CM | POA: Diagnosis not present

## 2021-05-21 DIAGNOSIS — M21372 Foot drop, left foot: Secondary | ICD-10-CM | POA: Diagnosis not present

## 2021-05-21 DIAGNOSIS — I10 Essential (primary) hypertension: Secondary | ICD-10-CM | POA: Diagnosis not present

## 2021-08-12 ENCOUNTER — Telehealth: Payer: Self-pay | Admitting: Neurology

## 2021-08-12 NOTE — Telephone Encounter (Signed)
Pt wife calling stating [place] medicare needs to speak with Dr. Jaynee Eagles regarding donepezil (ARICEPT) 10 MG tablet. Would like a call back.  ? ?[phone] ?[phone] ?

## 2021-08-13 NOTE — Telephone Encounter (Signed)
Patient's wife called me back and stated the letter she had received indicated that Aricept is now under quantity limit of 30 tablets per 30 days.  Patient states the letter indicates the provider can request an exception.  She will bring the letter by the office either this afternoon or Monday morning.  I told her we would look into this.  We also discussed the possibility that if insurance denies the quantity limit exception request, the remaining 30 tablets could be purchased with a GoodRx coupon, at [place] for example for $9.  She verbalized appreciation. ?

## 2021-08-13 NOTE — Telephone Encounter (Signed)
I returned the wife's call and LVM (ok per DPR) advising I spoke with the pharmacy and was told the Aricept is too soon to fill but will be due on April 24.  I advised that if the patient is still taking his medication and he has some left then he cannot fill it yet.  Left office number for call back if they have any other questions. ?

## 2021-08-13 NOTE — Telephone Encounter (Signed)
The pt's wife reports he takes it twice daily. She is afraid to rock the boat and states he has been doing well on Aricept and Namenda, both twice daily. I reviewed the chart.  The [facility] notes from past treatment said to take Aricept 10 mg QHS. Will discuss with Dr Jaynee Eagles before doing any PAs for BID dosing.  ?

## 2021-08-13 NOTE — Telephone Encounter (Signed)
Spoke with the pharmacy.  I was told its just too soon to fill, due on 4/24.  ?

## 2021-08-13 NOTE — Telephone Encounter (Signed)
Will check with pharmacy when they open. Prescription has been active for 7 months.  ?

## 2021-08-18 NOTE — Telephone Encounter (Signed)
Spoke with Dr Jaynee Eagles. We can try to do a PA and see if insurance will approve #60/30 but if they will not, patient can pay cash for the remaining 30 tablets that insurance won't cover OR we could switch patient to the 23 mg ER tablet once daily.  ?

## 2021-08-18 NOTE — Telephone Encounter (Addendum)
Submitted PA on Cover My Meds. KeyLoleta Chance - PA Case ID: [id number]. Awaiting determination from [facility].  ? ?Called wife [name] (on [place]) and LVM (ok per DPR) advising that PA has been sent to plan and is pending. Options if insurance denies is to pay cash with good rx for remaining 30 tablets per 30 days or switch him to 23 mg ER tablet.  ?

## 2021-08-19 NOTE — Telephone Encounter (Signed)
Received fax from [facility]. Donepezil 10 mg has been approved for a quantity of #60/30 from 05/03/21-05/02/22. Approval letter has been faxed to pharmacy. Received a receipt of confirmation. ? ?

## 2021-12-09 IMAGING — CT CT HEAD W/O CM
3 series · 16 of 47 positions shown, 19 images · non-contrast
Comparison: None.

CLINICAL DATA: Onset left hand numbness when the patient woke up
this morning.

EXAM:
CT HEAD WITHOUT CONTRAST
TECHNIQUE: Contiguous axial images were obtained from the base of the skull
through the vertex without intravenous contrast.

[Series 2: head wo · axial · 0.44mm/px · z∈[-128,+2]mm · 10 of 32 slices shown, 13 images]
[im 3/32  brain]
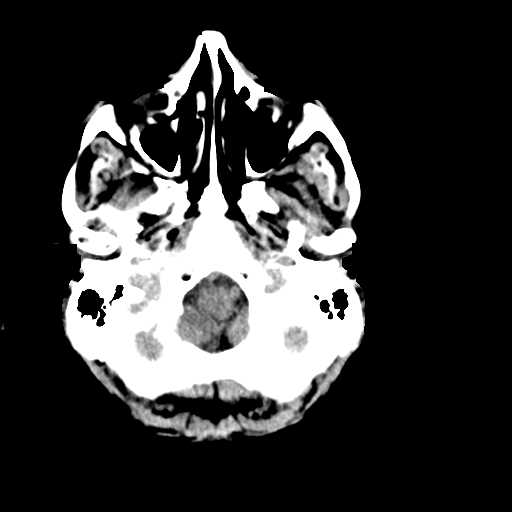
[im 3/32  bone]
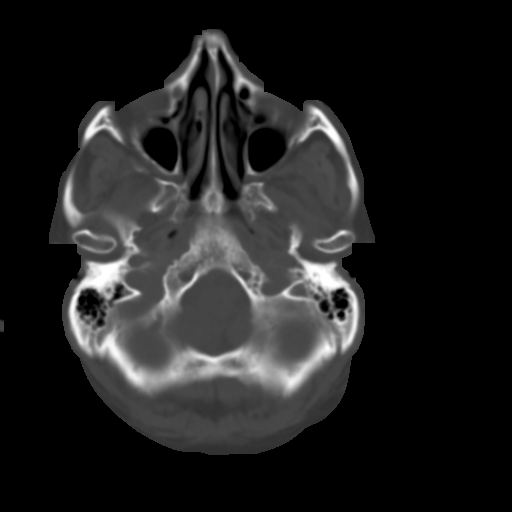
[im 6/32  brain]
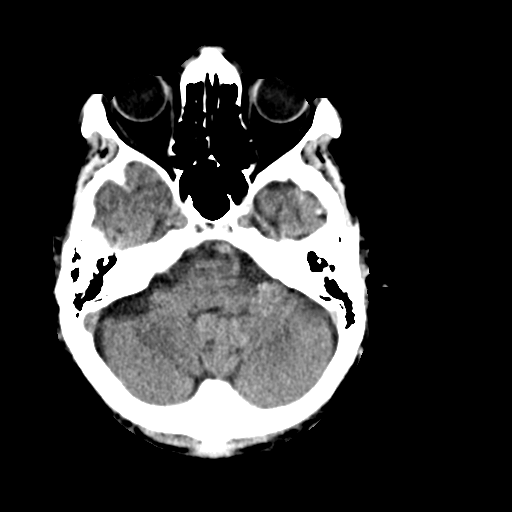
[im 9/32  brain]
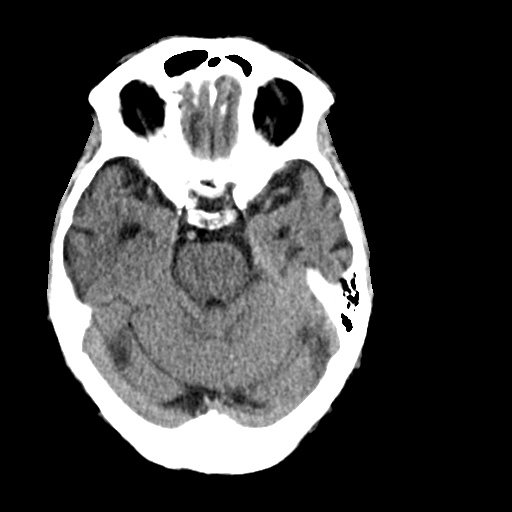
[im 11/32  brain]
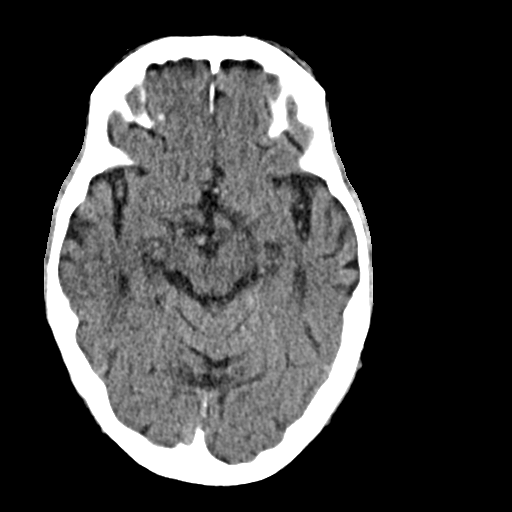
[im 14/32  brain]
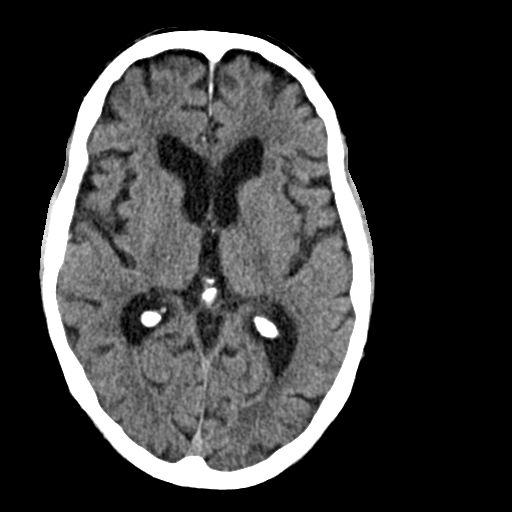
[im 14/32  bone]
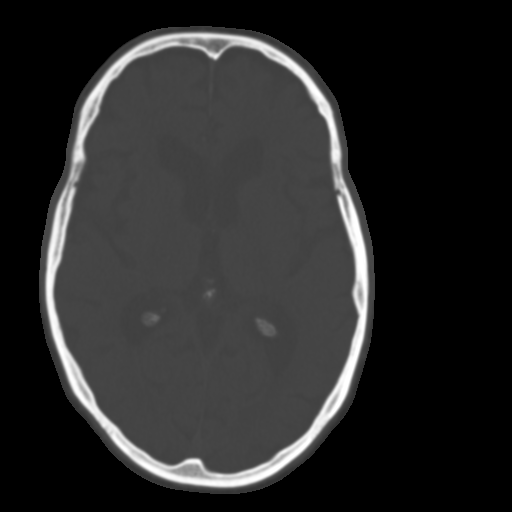
[im 18/32  brain]
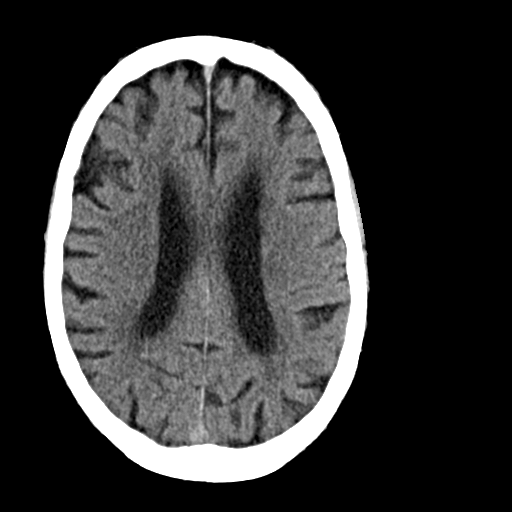
[im 21/32  brain]
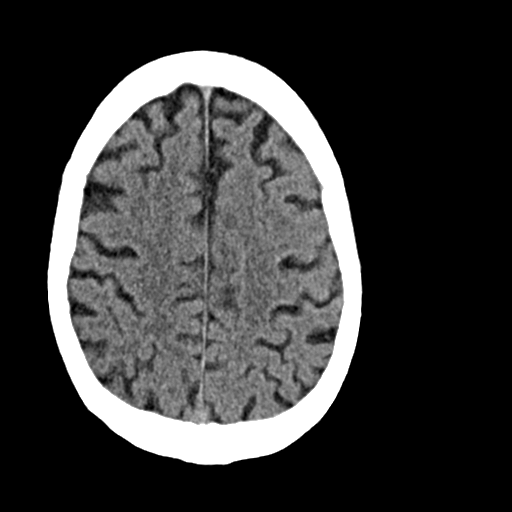
[im 24/32  brain]
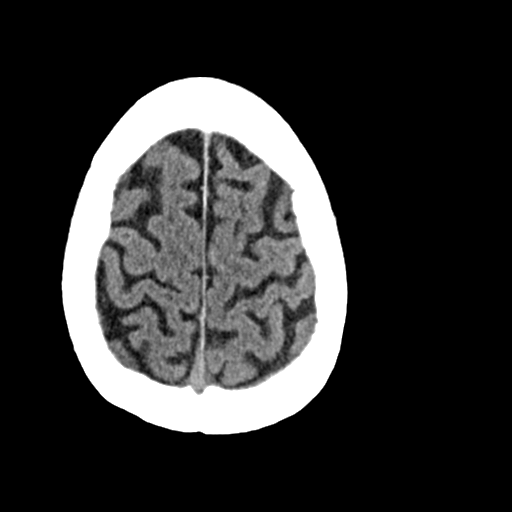
[im 26/32  brain]
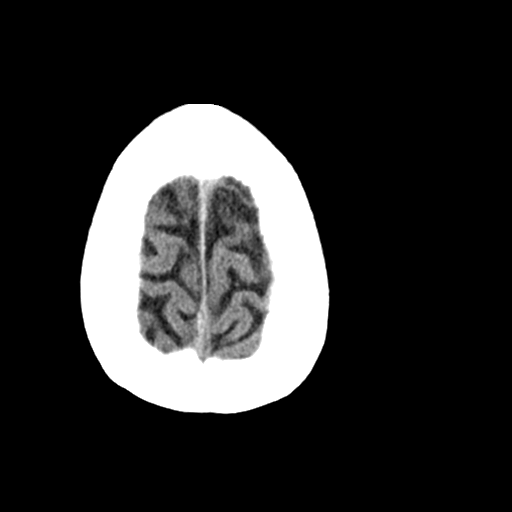
[im 26/32  bone]
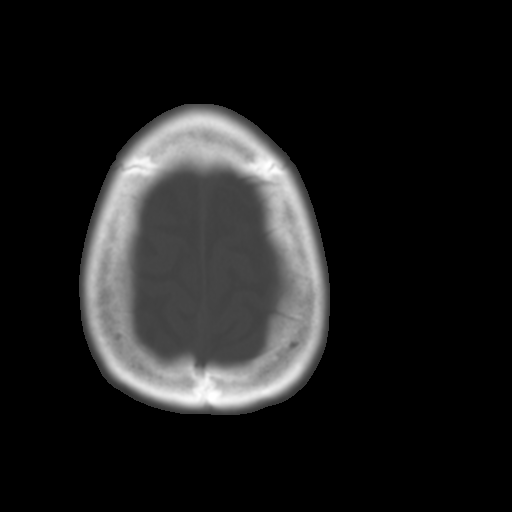
[im 29/32  brain]
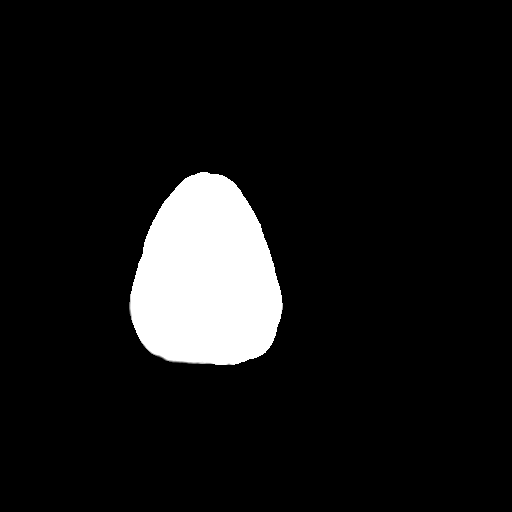

[Series 4: coronal soft · coronal · 0.31mm/px · 3 of 77 slices shown]
[im 26/77  brain]
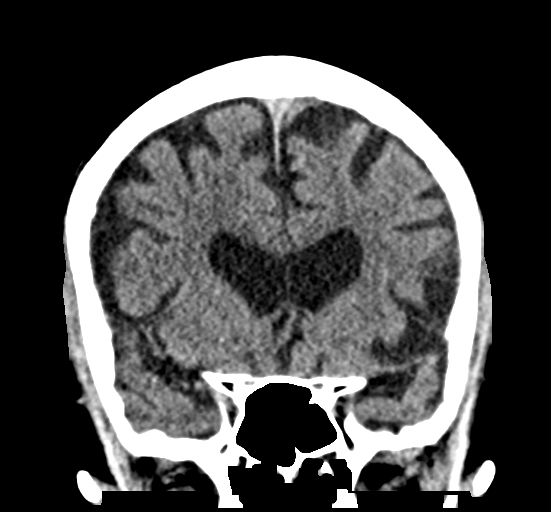
[im 34/77  brain]
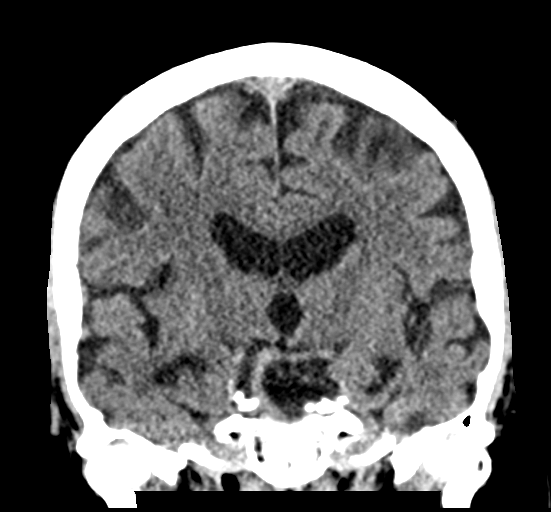
[im 43/77  brain]
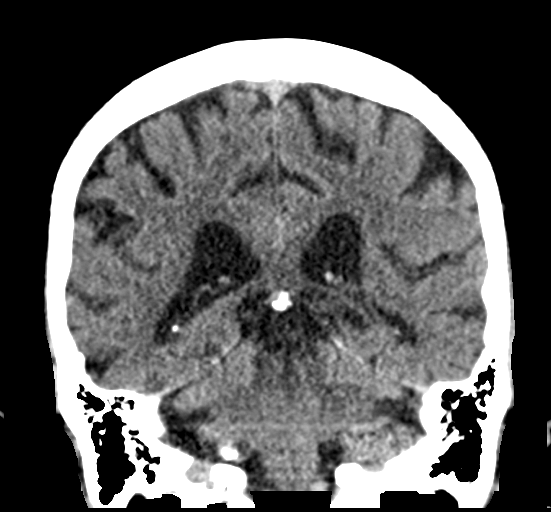

[Series 5: sag soft · sagittal · 0.31mm/px · 3 of 53 slices shown]
[im 18/53  brain]
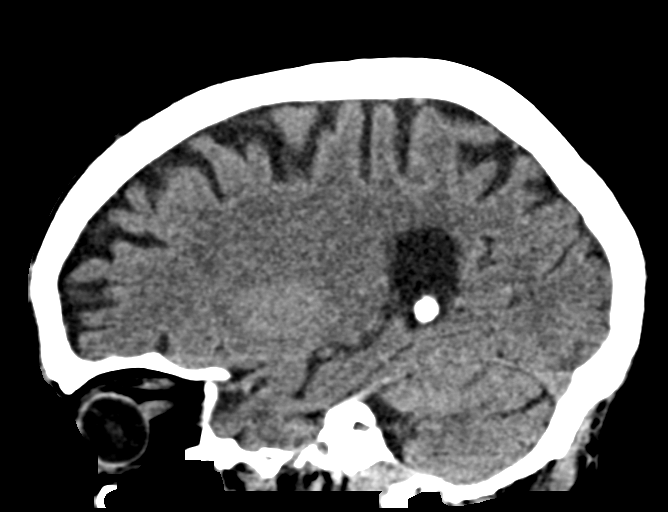
[im 27/53  brain]
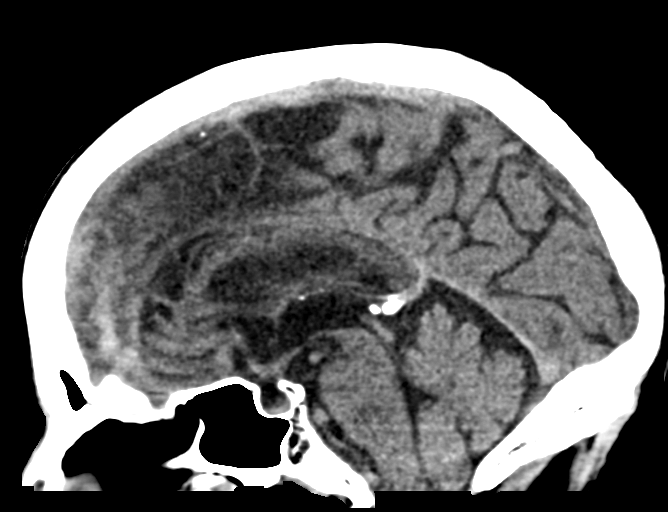
[im 35/53  brain]
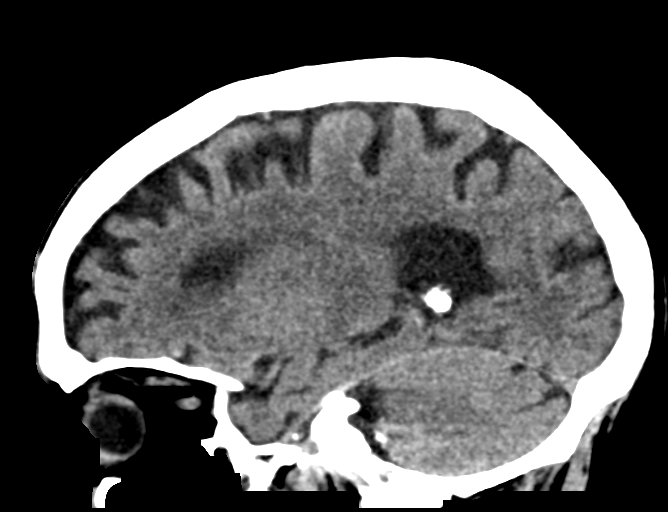

[16 of 47 positions shown; findings below may reference images not displayed]

FINDINGS: Brain: No evidence of acute infarction, hemorrhage, hydrocephalus,
extra-axial collection or mass lesion/mass effect.

Vascular: Atherosclerosis noted.

Skull: Intact.  No focal lesion.

Sinuses/Orbits: Status post cataract surgery.  Otherwise negative.

Other: None.
IMPRESSION: No acute abnormality.

Atherosclerosis.

## 2022-01-06 ENCOUNTER — Other Ambulatory Visit: Payer: Self-pay | Admitting: Cardiovascular Disease

## 2022-01-06 ENCOUNTER — Encounter: Payer: Self-pay | Admitting: Neurology

## 2022-01-06 ENCOUNTER — Ambulatory Visit: Payer: Medicare HMO | Admitting: Neurology

## 2022-01-06 VITALS — BP 127/62 | HR 57 | Ht 67.0 in | Wt 153.8 lb

## 2022-01-06 DIAGNOSIS — G301 Alzheimer's disease with late onset: Secondary | ICD-10-CM | POA: Diagnosis not present

## 2022-01-06 DIAGNOSIS — F0280 Dementia in other diseases classified elsewhere without behavioral disturbance: Secondary | ICD-10-CM | POA: Diagnosis not present

## 2022-01-06 DIAGNOSIS — R69 Illness, unspecified: Secondary | ICD-10-CM | POA: Diagnosis not present

## 2022-01-06 MED ORDER — MEMANTINE HCL 10 MG PO TABS: 10.0000 mg | ORAL_TABLET | Freq: Two times a day (BID) | ORAL | 4 refills | Status: DC

## 2022-01-06 MED ORDER — DONEPEZIL HCL 10 MG PO TABS: 10.0000 mg | ORAL_TABLET | Freq: Two times a day (BID) | ORAL | 4 refills | Status: DC

## 2022-01-06 NOTE — Progress Notes (Signed)
[id number]    Provider:  Dr Jaynee Eagles Requesting Provider: Lawerance Cruel, MD Primary Care Provider:  Lawerance Cruel, MD  CC:  alzheimer disease  01/06/2022: Patient is here today with his wife who provides all information he is pleasant, he is "progressing gracefully", daughter Nira Conn could not be here with [place] today, Nira Conn [phone].  Per wife, there have been no significant changes, he eats well, he sleeps well, she feels very blessed, there have been no falls, he is eating well, we discussed he has been on twice a day Aricept for many years, last year we had a difficult time getting insurance to approve 10 mg twice a day, however wife felt very strongly she did not want to change anything since he is done so well, usually we do only recommend 10 mg but Aricept is approved for 23 mg daily, no side effects to the high dose of aricept, culd not complete MMSE (last 11/30).  Patient is very pleasant today, he still has a very good sense of humor, wife is lovely, they have a wonderful daughter who could not make it here today with them.  I will give daughter a call or text just to check in.  We discussed dementia today, lovely wife, feels blessed, absolutely phenomenal family.  Lastly we discussed Lecanumab, at this late stage he would not be a candidate, but we do not know what the future holds.  No other focal neurologic deficits, associated symptoms, inciting events or modifiable factors.  Patient complains of symptoms per HPI as well as the following symptoms: dementia . Pertinent negatives and positives per HPI. All others negative   HPI:  Alexander [name] is a 81 y.o. male here as requested by Lawerance Cruel, MD for alzheimer disease.  Past medical history intervertebral disc degeneration lumbar region, Alzheimer's disease with late onset, coronary artery disease and a history of MI, peripheral vascular disease, hypertension, hypercholesterolemia.  I reviewed Dr.  Alan Ripper notes, patient is already on Aricept and memantine, he has already been seen by [facility] and I reviewed their notes as well, it appears as though he has been doing well since seeing [facility], I reviewed Neurology notes as wee: Lenox Ponds was added in January 2019 at that time his MoCA was a 19 out of 30, no signs of agitation irritation or anger, they continue to go to the [facility] [place] 5 days a week, he attends water aerobics class, no trouble managing himself inside and out of the locker room, he still attends the [employer] and meetings every month, they have a close group of friends have known for greater than 40 years and they continue to socialize, he has no delusions, hallucinations, agitation, depression, anxiety, apathy disinhibition and irritability no disturbance nighttime behaviors changes in appetite or eating, his neurologic and physical exam appear relatively unremarkable, diagnosed with late onset Alzheimer's disease without behavioral disturbance, initial evaluation was on September 2017 with dementia most likely Alzheimer's of moderate severity and that is when he started the Aricept.  The whole family is here, wife and daughter provide most information, diagnosed with late onset alzheimers, wife is his primary caregiver, they still socialize often, there are no behavioral problems, they have a lot of support, still in their house of 98 years, good neighbors, and a wonderful daughter and son. Patient always has a great sense of humor, no behavioral issues, no sleeping issues, he will get up and in the middle of  the night to urinate but thee is a light on, no snoring, he naps a little during the day but sleeps well as night, he has had 3 back surgeries and he is on Gabapentin, they used to go to Dr. Maryjean Ka but they don;t need any injections and we discussed the new pain management doctor at [facility] should he need, he is not hurting, the gabapentin  may make him more tired but it helps with pain. He struglles with his words. Appetite is good, no falls, hydration is good, getting out socially. No significant caregiver stress, wife is lovely and so is daughter. Wif helps him with dressing, he can get a little confused, [facility] because it is harder for him to be independent, they have a recumbent bike. He is still a gentleman.   Reviewed notes, labs and imaging from outside physicians, which showed: see above for review of notes also:  Patient's wife is primary caregiver, memory unit nursing homes not necessary at this point according to his wife, B12 and thyroid has been checked in the past per notes last thyroid was normal, last CBC and CMP were unremarkable with BUN 22 and creatinine 1.22.    Review of Systems: Patient complains of symptoms per HPI as well as the following symptoms Alzheimers.. Pertinent negatives and positives per HPI. All others negative.   Social History   Socioeconomic History   Marital status: Married    Spouse name: Not on file   Number of children: Not on file   Years of education: Not on file   Highest education level: Not on file  Occupational History   Not on file  Tobacco Use   Smoking status: Former   Smokeless tobacco: Never  Vaping Use   Vaping Use: Never used  Substance and Sexual Activity   Alcohol use: Yes    Comment: social drinks wine   Drug use: No   Sexual activity: Not on file  Other Topics Concern   Not on file  Social History Narrative   Not on file   Social Determinants of Health   Financial Resource Strain: Not on file  Food Insecurity: Not on file  Transportation Needs: Not on file  Physical Activity: Not on file  Stress: Not on file  Social Connections: Not on file  Intimate Partner Violence: Not on file    Family History  Problem Relation Age of Onset   Heart attack Father 33   Cancer Mother    Cancer Brother     Past Medical History:   Diagnosis Date   Acute inferior myocardial infarction [facility])     Stent to right coronary on Sep 06, 2007 with 3.5 x 20 mm Liberte non-drug eluting stent   Alzheimer disease ([place])    Arthritis    Coronary artery disease    Stent to prox RCA 2009 with cutting balloon to PDA   Dementia ([place])    memory problems   GERD (gastroesophageal reflux disease)    History of tobacco abuse    Hyperlipidemia    Neuromuscular disorder [facility])     Patient Active Problem List   Diagnosis Date Noted   Chronic kidney disease, stage 3a ([place]) 01/07/2022   Diverticulitis of colon 01/07/2022   Essential hypertension 01/07/2022   Gastro-esophageal reflux disease without esophagitis 01/07/2022   Peripheral neuropathy 01/07/2022   Pure hypercholesterolemia 01/07/2022   Atherosclerotic heart disease of native coronary artery without angina pectoris 01/07/2022  Lumbar degenerative disc disease 01/05/2021   Chronic bilateral low back pain 01/05/2021   Gait abnormality 01/05/2021   Late onset Alzheimer's dementia without behavioral disturbance ([place]) 01/05/2021   Left foot drop 01/05/2021   Sensorineural hearing loss (SNHL), bilateral 05/15/2020   Late onset Alzheimer's disease without behavioral disturbance ([place]) 10/11/2016   HNP (herniated nucleus pulposus), lumbar 04/10/2015   PAD (peripheral artery disease) ([place]) 05/09/2014   CAD (coronary artery disease) 03/03/2011   Hyperlipidemia 03/03/2011    Past Surgical History:  Procedure Laterality Date   BACK SURGERY   February 2009   BELPHAROPTOSIS REPAIR     CARDIAC CATHETERIZATION  2009   LUMBAR LAMINECTOMY/DECOMPRESSION MICRODISCECTOMY Left 04/10/2015   Procedure: LUMBAR LAMINECTOMY/DECOMPRESSION MICRODISCECTOMY 1 LEVEL;  Surgeon: Jovita Gamma, MD;  Location: [facility];  Service: Neurosurgery;  Laterality: Left;  Left L45 laminotomy   TONSILLECTOMY      Current Outpatient Medications  Medication Sig Dispense Refill   aspirin 81 MG tablet Take 81  mg by mouth daily.     co-enzyme Q-10 30 MG capsule Take 30 mg by mouth daily.     famotidine (PEPCID) 20 MG tablet Take 20 mg by mouth at bedtime as needed.     gabapentin (NEURONTIN) 600 MG tablet Take 600 mg by mouth 3 (three) times daily.     nitroGLYCERIN (NITROSTAT) 0.4 MG SL tablet Place 1 tablet (0.4 mg total) under the tongue every 5 (five) minutes as needed for chest pain. 25 tablet 1   Omega-3 Fatty Acids (FISH OIL) 1000 MG CAPS Take 1,000 mg by mouth daily.     donepezil (ARICEPT) 10 MG tablet Take 1 tablet (10 mg total) by mouth 2 (two) times daily. 180 tablet 4   memantine (NAMENDA) 10 MG tablet Take 1 tablet (10 mg total) by mouth 2 (two) times daily. 180 tablet 4   metoprolol tartrate (LOPRESSOR) 25 MG tablet Take 0.5 tablets (12.5 mg total) by mouth 2 (two) times daily. Please call to schedule an overdue appointment with Dr. Acie Fredrickson for refills, [phone], thank you. 1st attempt. 90 tablet 3   No current facility-administered medications for this visit.    Allergies as of 01/06/2022   (No Known Allergies)    Vitals: BP 127/62   Pulse (!) 57   Ht '5\' 7"'$  (1.702 m)   Wt 153 lb 12.8 oz (69.8 kg)   BMI 24.09 kg/m  Last Weight:  Wt Readings from Last 1 Encounters:  01/06/22 153 lb 12.8 oz (69.8 kg)   Last Height:   Ht Readings from Last 1 Encounters:  01/06/22 '5\' 7"'$  (1.702 m)     Physical exam: Exam: Gen: NAD, conversant, well nourised, well groomed                     CV: RRR, no MRG. No Carotid Bruits. No peripheral edema, warm, nontender Eyes: Conjunctivae clear without exudates or hemorrhage  Neuro: Detailed Neurologic Exam  Speech:    Speech is normal; fluent  Cognition:     01/01/2021   11:37 AM  MMSE - Mini Mental State Exam  Orientation to time 1  Orientation to Place 1  Registration 3  Attention/ Calculation 0  Recall 0  Language- name 2 objects 2  Language- repeat 1  Language- follow 3 step command 3  Language- read & follow direction 0   Write a sentence 0  Copy design 0  Total score 11    Cranial Nerves:    The  pupils are equal, round, and reactive to light. Pupils too small to visualize fundi.  Visual fields are full to threat. Extraocular movements are intact. Trigeminal sensation is intact and the muscles of mastication are normal. The face is symmetric. The palate elevates in the midline. Hearing impaired. Voice is normal. Shoulder shrug is normal. The tongue has normal motion without fasciculations.   Coordination:    No dysmetria or ataxia noted   Gait:    Steppage gait bilat on left  Motor Observation:    No asymmetry, no atrophy, and no involuntary movements noted. Tone:    Normal muscle tone.    Posture:    Posture is slightly stooped    Strength:    Left foot drop     Sensation: intact to LT     Reflex Exam:  DTR's:    Deep tendon reflexes in the upper and lower extremities are symmetrical bilaterally.   Toes:    The toes are equivocal bilaterally.  Left upgoing?  Clonus:    Clonus is absent.    Assessment/Plan: Patient with Alzheimer's, diagnosed 2017 which was his initial evaluation at [facility], most likely Alzheimer's of moderate severity, he was started on Aricept at that time, Lenox Ponds was added in 2019 he was followed by Dr. Rex Kras in the past who was recently retired, now seeing Dr. Harrington Challenger, diagnosed with late onset Alzheimer's disease without behavioral disturbance he also follows with [place] [facility] and the last time he was seen surgery was not suggested due to his dementia and surgery could have a significant impact on his cognitive status.   - diagnosed with late onset alzheimers, wife is his primary caregiver, they still socialize often, there are no behavioral problems, they have a lot of support, still in their house of >36 years, good neighbors, and a wonderful daughter and son. Patient always has a great sense of humor, no behavioral issues,  - last year referred  to aquatic physical therapy at the [facility]  - Left foot drop, referred to orthotics, doesn't wear it  - continue aricept and namenda current doses, had a difficult time with insurance, was eventually approved with [place], been on '10mg'$  BID and doing well for years, no side effects  - discussedThe "XX BRAIN" Eden Emms with daughter and the [place] ordered this encounter  Medications   donepezil (ARICEPT) 10 MG tablet    Sig: Take 1 tablet (10 mg total) by mouth 2 (two) times daily.    Dispense:  180 tablet    Refill:  4   memantine (NAMENDA) 10 MG tablet    Sig: Take 1 tablet (10 mg total) by mouth 2 (two) times daily.    Dispense:  180 tablet    Refill:  4     Cc: Lawerance Cruel, MD,  Lawerance Cruel, MD  Sarina Ill, MD  [facility] [address] [place], [place] [address]  Phone [phone] Fax [phone]  I spent over 60  minutes of face-to-face and non-face-to-face time with patient on the  1. Late onset Alzheimer's dementia without behavioral disturbance (HCC)     diagnosis.  This included previsit chart review, lab review, study review, order entry, electronic health record documentation, patient education on the different diagnostic and therapeutic options, counseling and coordination of care, risks and benefits of management, compliance, or risk factor reduction

## 2022-01-07 DIAGNOSIS — N1831 Chronic kidney disease, stage 3a: Secondary | ICD-10-CM | POA: Insufficient documentation

## 2022-01-07 DIAGNOSIS — I2510 Atherosclerotic heart disease of native coronary artery without angina pectoris: Secondary | ICD-10-CM | POA: Insufficient documentation

## 2022-01-07 DIAGNOSIS — I10 Essential (primary) hypertension: Secondary | ICD-10-CM | POA: Insufficient documentation

## 2022-01-07 DIAGNOSIS — E7800 Pure hypercholesterolemia, unspecified: Secondary | ICD-10-CM | POA: Insufficient documentation

## 2022-01-07 DIAGNOSIS — G629 Polyneuropathy, unspecified: Secondary | ICD-10-CM | POA: Insufficient documentation

## 2022-01-07 DIAGNOSIS — K5732 Diverticulitis of large intestine without perforation or abscess without bleeding: Secondary | ICD-10-CM | POA: Insufficient documentation

## 2022-01-07 DIAGNOSIS — K219 Gastro-esophageal reflux disease without esophagitis: Secondary | ICD-10-CM | POA: Insufficient documentation

## 2022-01-10 NOTE — Patient Instructions (Signed)
Continue current regimen

## 2022-02-09 DIAGNOSIS — D2262 Melanocytic nevi of left upper limb, including shoulder: Secondary | ICD-10-CM | POA: Diagnosis not present

## 2022-02-09 DIAGNOSIS — L821 Other seborrheic keratosis: Secondary | ICD-10-CM | POA: Diagnosis not present

## 2022-02-09 DIAGNOSIS — D225 Melanocytic nevi of trunk: Secondary | ICD-10-CM | POA: Diagnosis not present

## 2022-02-09 DIAGNOSIS — L570 Actinic keratosis: Secondary | ICD-10-CM | POA: Diagnosis not present

## 2022-02-09 DIAGNOSIS — L720 Epidermal cyst: Secondary | ICD-10-CM | POA: Diagnosis not present

## 2022-02-09 DIAGNOSIS — D2261 Melanocytic nevi of right upper limb, including shoulder: Secondary | ICD-10-CM | POA: Diagnosis not present

## 2022-02-09 DIAGNOSIS — L438 Other lichen planus: Secondary | ICD-10-CM | POA: Diagnosis not present

## 2022-02-09 DIAGNOSIS — D485 Neoplasm of uncertain behavior of skin: Secondary | ICD-10-CM | POA: Diagnosis not present

## 2022-02-09 DIAGNOSIS — Z85828 Personal history of other malignant neoplasm of skin: Secondary | ICD-10-CM | POA: Diagnosis not present

## 2022-02-15 ENCOUNTER — Ambulatory Visit: Payer: Medicare HMO | Attending: Cardiovascular Disease | Admitting: Cardiovascular Disease

## 2022-02-15 ENCOUNTER — Encounter: Payer: Self-pay | Admitting: Cardiovascular Disease

## 2022-02-15 VITALS — BP 120/62 | HR 75 | Ht 67.0 in | Wt 154.2 lb

## 2022-02-15 DIAGNOSIS — E782 Mixed hyperlipidemia: Secondary | ICD-10-CM | POA: Diagnosis not present

## 2022-02-15 DIAGNOSIS — I2510 Atherosclerotic heart disease of native coronary artery without angina pectoris: Secondary | ICD-10-CM

## 2022-02-15 NOTE — Progress Notes (Signed)
Alexander [name] Date of Birth  1940/08/05   [address]    [place], [place]  [address] [phone]  Fax  [phone]  Problem List 1. CAD- s/p stenting 2009 [name])  2. Hyperlipidemia 3. Peripheral Vascular  disease  4. Peripheral neuropathy  5. alzheimers Disease    Alexander [name] is a 81 year old gentleman with a history of coronary artery disease. Status post inferior wall myocardial infarction in 2009. He had a stent placed at that time. He is also status post cutting balloon procedure in 2009.  Has a history of hyperlipidemia.  He's done very well. He works out at [employer] on a regular basis. He's not had any episodes of chest pain or shortness of breath.  He still works out at [employer].   Sep 18, 2012:  Still working out at [employer].   He is having some memory problems and wants to decrease the dose of crestor.   Nov. 17, 2014:  Doing well, having some sinus problems.  No CP.   Still going to [facility] 5 days a week.   His most recent lipid profile had an LDL of 103 - he had tried to decrease his crestor to 10.  He is back on 20 mg a day.  October 05, 2013: Alexander [name] is a doing very well. He's a former patient of Dr. [name].   He goes [facility] the [employer]   Jan. 7, 2016: Alexander [name] is a 81 yo with hx of CAD and hyperlipidemia. [name] is not doing as well today.  Is having some back pain.  He recently saw Dr. [name] at the [facility] .  He having some control issues with his right leg.    December 30, 2014:  Has some shortness of breath with exertion.  Very short of breath when he climbs 2 flights of stairs Has had some leg pain , wakes him up at night. Has known PVD   July 03, 2015  [name] had some shortness of breath .   Had an echo card gram revealed normal left ventricle systolic function. He did have mild diastolic dysfunction. Breathing is much better, not on any new meds.  Still goes to the [facility] and [facility] in the [facility] ( shallow [facility] of the pool) Had back surgery Dec. 8, 2016  - had  lost all strength in his left leg  Has neuropathy in both feet.   BP and HR look great today   Oct. 2, 2017:  Was seen with his wife  [name]. No CP or dyspnea. Goes to the [facility] 5 days a week.   Was diagnosed with Alzheimers this past week,   Started Aricept last night   July 26, 2016:  Doing well.   Still goes to the [facility]  5 times a week ( goes at 6 AM every day )  Was seen with his wife  [name]. No CP or dyspnea Recent labs at Dr. Rex Kras - [facility]    Jan. 15, 2019:  doing well.   No CP , Was seen with his wife  [name]. Goes to the [facility] 5 days a week - 3 times a week for water aerobics and 2 days on the bike .  Dr. Rex Kras manages his chol.   January 30, 2018: Alexander [name] is seen today for follow-up of his coronary artery disease. BP is a bit lower than usual . No syncope or presyncope  Does not eat much salt .  Still going to  the [facility] 5 day days a week  No CP or dyspnea   With working out.   BP is low,   Will reduce his metoprolol      Aug. 28, 2020   Alexander [name] is seen today for follow up of his CAD , HLD, PVD. He also has some alzheimers disease Just restarted his water aerobics .   Aug. 30, 2021:  BP is low today  No symptoms  Is eating well .  No exercising , unable to exercise  No angina ,  Hx of CAD, HLD ,   Aug. 29, 2022:   Alexander [name] is seen today for follow up of his CAD  Progressive dementia  Seen with wife, [name]  Dealing with water damage from a broken washing machine  Oct. 16, 2023  Alexander [name] is seen for follow up of his CAD  Progressive dementia   Feeling well .  Uses a recumbant bike every day . No CP or dyspnea  Wife [name] did most of the talking   Current Outpatient Medications on File Prior to Visit  Medication Sig Dispense Refill   aspirin 81 MG tablet Take 81 mg by mouth daily.     co-enzyme Q-10 30 MG capsule Take 30 mg by mouth daily.     donepezil (ARICEPT) 10 MG tablet Take 1 tablet (10 mg total) by mouth 2 (two) times  daily. 180 tablet 4   famotidine (PEPCID) 20 MG tablet Take 20 mg by mouth at bedtime as needed.     gabapentin (NEURONTIN) 600 MG tablet Take 600 mg by mouth 3 (three) times daily.     memantine (NAMENDA) 10 MG tablet Take 1 tablet (10 mg total) by mouth 2 (two) times daily. 180 tablet 4   metoprolol tartrate (LOPRESSOR) 25 MG tablet Take 0.5 tablets (12.5 mg total) by mouth 2 (two) times daily. Please call to schedule an overdue appointment with Dr. Acie Fredrickson for refills, [phone], thank you. 1st attempt. 90 tablet 3   nitroGLYCERIN (NITROSTAT) 0.4 MG SL tablet Place 1 tablet (0.4 mg total) under the tongue every 5 (five) minutes as needed for chest pain. 25 tablet 1   Omega-3 Fatty Acids (FISH OIL) 1000 MG CAPS Take 1,000 mg by mouth daily.     rosuvastatin (CRESTOR) 40 MG tablet Take 40 mg by mouth daily. (Patient not taking: Reported on 02/15/2022)     No current facility-administered medications on file prior to visit.    No Known Allergies  Past Medical History:  Diagnosis Date   Acute inferior myocardial infarction [facility])     Stent to right coronary on Sep 06, 2007 with 3.5 x 20 mm Liberte non-drug eluting stent   Alzheimer disease ([place])    Arthritis    Coronary artery disease    Stent to prox RCA 2009 with cutting balloon to PDA   Dementia [facility])    memory problems   GERD (gastroesophageal reflux disease)    History of tobacco abuse    Hyperlipidemia    Neuromuscular disorder [facility])     Past Surgical History:  Procedure Laterality Date   BACK SURGERY   February 2009   BELPHAROPTOSIS REPAIR     CARDIAC CATHETERIZATION  2009   LUMBAR LAMINECTOMY/DECOMPRESSION MICRODISCECTOMY Left 04/10/2015   Procedure: LUMBAR LAMINECTOMY/DECOMPRESSION MICRODISCECTOMY 1 LEVEL;  Surgeon: Jovita Gamma, MD;  Location: [facility];  Service: Neurosurgery;  Laterality: Left;  Left L45 laminotomy   TONSILLECTOMY      Social History   Tobacco Use  Smoking Status Former  Smokeless Tobacco  Never    Social History   Substance and Sexual Activity  Alcohol Use Yes   Comment: social drinks wine    Family History  Problem Relation Age of Onset   Heart attack Father 35   Cancer Mother    Cancer Brother     Reviw of Systems:  Noted in current history, all other systems are negative.   Physical Exam: Blood pressure 120/62, pulse 75, height '5\' 7"'$  (1.702 m), weight 154 lb 3.2 oz (69.9 kg), SpO2 97 %.       GEN:  Well nourished, well developed in no acute distress HEENT: Normal NECK: No JVD; No carotid bruits LYMPHATICS: No lymphadenopathy CARDIAC: RRR , no murmurs, rubs, gallops RESPIRATORY:  Clear to auscultation without rales, wheezing or rhonchi  ABDOMEN: Soft, non-tender, non-distended MUSCULOSKELETAL:  No edema; No deformity  SKIN: Warm and dry NEUROLOGIC:  Alert and oriented x 3    EKG: February 15, 2022: Normal sinus rhythm at 75.  Moderate voltage criteria for left ventricular hypertrophy.    Assessment / Plan:   1. CAD- s/p stenting 2009 ( [place])   no angina ,  cont current meds.     2. Hyperlipidemia -    stable       3. Peripheral vascular disease:    4. Peripheral neuropathy :    5.  Alzheimers Disease -  progressive dementia    Mertie Moores, MD  02/15/2022 [facility] [place],  [place], [place]  [address] Pager [phone] Phone: [phone]; Fax: [phone]

## 2022-02-15 NOTE — Patient Instructions (Signed)
Medication Instructions:  Your physician recommends that you continue on your current medications as directed. Please refer to the Current Medication list given to you today.  *If you need a refill on your cardiac medications before your next appointment, please call your pharmacy*  Lab Work: If you have labs (blood work) drawn today and your tests are completely normal, you will receive your results only by: [place] (if you have MyChart) OR A paper copy in the mail If you have any lab test that is abnormal or we need to change your treatment, we will call you to review the results.  Testing/Procedures: None ordered today.  Follow-Up: At [facility], you and your health needs are our priority.  As part of our continuing mission to provide you with exceptional heart care, we have created designated Provider Care Teams.  These Care Teams include your primary Cardiologist (physician) and Advanced Practice Providers (APPs -  Physician Assistants and Nurse Practitioners) who all work together to provide you with the care you need, when you need it.  We recommend signing up for the patient portal called "MyChart".  Sign up information is provided on this After Visit Summary.  MyChart is used to connect with patients for Virtual Visits (Telemedicine).  Patients are able to view lab/test results, encounter notes, upcoming appointments, etc.  Non-urgent messages can be sent to your provider as well.   To learn more about what you can do with MyChart, go to [email or web address].    Your next appointment:   1 year(s)  The format for your next appointment:   In Person  Provider:   Mertie Moores, MD      Important Information About Sugar

## 2022-03-02 DIAGNOSIS — H26491 Other secondary cataract, right eye: Secondary | ICD-10-CM | POA: Diagnosis not present

## 2022-03-02 DIAGNOSIS — H35373 Puckering of macula, bilateral: Secondary | ICD-10-CM | POA: Diagnosis not present

## 2022-03-02 DIAGNOSIS — Z961 Presence of intraocular lens: Secondary | ICD-10-CM | POA: Diagnosis not present

## 2022-03-08 DIAGNOSIS — M21372 Foot drop, left foot: Secondary | ICD-10-CM | POA: Diagnosis not present

## 2022-03-11 ENCOUNTER — Other Ambulatory Visit: Payer: Self-pay | Admitting: Neurology

## 2022-05-10 ENCOUNTER — Other Ambulatory Visit: Payer: Self-pay | Admitting: Neurology

## 2022-05-10 MED ORDER — DONEPEZIL HCL 10 MG PO TABS: 10.0000 mg | ORAL_TABLET | Freq: Two times a day (BID) | ORAL | 4 refills | Status: DC

## 2022-05-19 DIAGNOSIS — I10 Essential (primary) hypertension: Secondary | ICD-10-CM | POA: Diagnosis not present

## 2022-05-26 DIAGNOSIS — I10 Essential (primary) hypertension: Secondary | ICD-10-CM | POA: Diagnosis not present

## 2022-05-26 DIAGNOSIS — K219 Gastro-esophageal reflux disease without esophagitis: Secondary | ICD-10-CM | POA: Diagnosis not present

## 2022-05-26 DIAGNOSIS — M21372 Foot drop, left foot: Secondary | ICD-10-CM | POA: Diagnosis not present

## 2022-05-26 DIAGNOSIS — E7800 Pure hypercholesterolemia, unspecified: Secondary | ICD-10-CM | POA: Diagnosis not present

## 2022-05-26 DIAGNOSIS — G629 Polyneuropathy, unspecified: Secondary | ICD-10-CM | POA: Diagnosis not present

## 2022-05-26 DIAGNOSIS — Z0000 Encounter for general adult medical examination without abnormal findings: Secondary | ICD-10-CM | POA: Diagnosis not present

## 2022-05-26 DIAGNOSIS — S61412A Laceration without foreign body of left hand, initial encounter: Secondary | ICD-10-CM | POA: Diagnosis not present

## 2022-05-26 DIAGNOSIS — Z6824 Body mass index (BMI) 24.0-24.9, adult: Secondary | ICD-10-CM | POA: Diagnosis not present

## 2022-05-26 DIAGNOSIS — G301 Alzheimer's disease with late onset: Secondary | ICD-10-CM | POA: Diagnosis not present

## 2022-05-26 DIAGNOSIS — I739 Peripheral vascular disease, unspecified: Secondary | ICD-10-CM | POA: Diagnosis not present

## 2022-10-05 DIAGNOSIS — K625 Hemorrhage of anus and rectum: Secondary | ICD-10-CM | POA: Diagnosis not present

## 2022-10-05 DIAGNOSIS — G301 Alzheimer's disease with late onset: Secondary | ICD-10-CM | POA: Diagnosis not present

## 2022-10-05 DIAGNOSIS — G629 Polyneuropathy, unspecified: Secondary | ICD-10-CM | POA: Diagnosis not present

## 2022-10-05 DIAGNOSIS — R32 Unspecified urinary incontinence: Secondary | ICD-10-CM | POA: Diagnosis not present

## 2022-10-05 DIAGNOSIS — M21372 Foot drop, left foot: Secondary | ICD-10-CM | POA: Diagnosis not present

## 2022-10-05 DIAGNOSIS — Z6822 Body mass index (BMI) 22.0-22.9, adult: Secondary | ICD-10-CM | POA: Diagnosis not present

## 2022-10-05 DIAGNOSIS — F0280 Dementia in other diseases classified elsewhere without behavioral disturbance: Secondary | ICD-10-CM | POA: Diagnosis not present

## 2022-10-05 DIAGNOSIS — R531 Weakness: Secondary | ICD-10-CM | POA: Diagnosis not present

## 2022-11-17 DIAGNOSIS — M21372 Foot drop, left foot: Secondary | ICD-10-CM | POA: Diagnosis not present

## 2022-12-28 DIAGNOSIS — N3941 Urge incontinence: Secondary | ICD-10-CM | POA: Diagnosis not present

## 2022-12-28 DIAGNOSIS — R3915 Urgency of urination: Secondary | ICD-10-CM | POA: Diagnosis not present

## 2022-12-29 ENCOUNTER — Other Ambulatory Visit: Payer: Self-pay | Admitting: Cardiovascular Disease

## 2023-01-12 ENCOUNTER — Ambulatory Visit: Payer: Medicare HMO | Admitting: Neurology

## 2023-01-12 NOTE — Progress Notes (Deleted)
[id number]    Provider:  Dr Lucia Gaskins Requesting Provider: Daisy Floro, MD Primary Care Provider:  Daisy Floro, MD  CC:  alzheimer disease  01/06/2022: Patient is here today with his wife who provides all information he is pleasant, he is "progressing gracefully", daughter Herbert Seta could not be here with [place] today, Herbert Seta [phone].  Per wife, there have been no significant changes, he eats well, he sleeps well, she feels very blessed, there have been no falls, he is eating well, we discussed he has been on twice a day Aricept for many years, last year we had a difficult time getting insurance to approve 10 mg twice a day, however wife felt very strongly she did not want to change anything since he is done so well, usually we do only recommend 10 mg but Aricept is approved for 23 mg daily, no side effects to the high dose of aricept, culd not complete MMSE (last 11/30).  Patient is very pleasant today, he still has a very good sense of humor, wife is lovely, they have a wonderful daughter who could not make it here today with them.  I will give daughter a call or text just to check in.  We discussed dementia today, lovely wife, feels blessed, absolutely phenomenal family.  Lastly we discussed Lecanumab, at this late stage he would not be a candidate, but we do not know what the future holds.  No other focal neurologic deficits, associated symptoms, inciting events or modifiable factors.  Patient complains of symptoms per HPI as well as the following symptoms: dementia . Pertinent negatives and positives per HPI. All others negative   HPI:  Alexander [name] is a 82 y.o. male here as requested by Daisy Floro, MD for alzheimer disease.  Past medical history intervertebral disc degeneration lumbar region, Alzheimer's disease with late onset, coronary artery disease and a history of MI, peripheral vascular disease, hypertension, hypercholesterolemia.  I reviewed Dr.  Charlott Rakes notes, patient is already on Aricept and memantine, he has already been seen by [facility] and I reviewed their notes as well, it appears as though he has been doing well since seeing [facility], I reviewed Neurology notes as wee: Candiss Norse was added in January 2019 at that time his MoCA was a 19 out of 30, no signs of agitation irritation or anger, they continue to go to the [facility] 5 days a week, he attends water aerobics class, no trouble managing himself inside and out of the locker room, he still attends the [employer] and meetings every month, they have a close group of friends have known for greater than 40 years and they continue to socialize, he has no delusions, hallucinations, agitation, depression, anxiety, apathy disinhibition and irritability no disturbance nighttime behaviors changes in appetite or eating, his neurologic and physical exam appear relatively unremarkable, diagnosed with late onset Alzheimer's disease without behavioral disturbance, initial evaluation was on September 2017 with dementia most likely Alzheimer's of moderate severity and that is when he started the Aricept.  The whole family is here, wife and daughter provide most information, diagnosed with late onset alzheimers, wife is his primary caregiver, they still socialize often, there are no behavioral problems, they have a lot of support, still in their house of 36 years, good neighbors, and a wonderful daughter and son. Patient always has a great sense of humor, no behavioral issues, no sleeping issues, he will get up and in the middle of  the night to urinate but thee is a light on, no snoring, he naps a little during the day but sleeps well as night, he has had 3 back surgeries and he is on Gabapentin, they used to go to Dr. Ollen Bowl but they don;t need any injections and we discussed the new pain management doctor at [facility] should he need, he is not hurting, the gabapentin  may make him more tired but it helps with pain. He struglles with his words. Appetite is good, no falls, hydration is good, getting out socially. No significant caregiver stress, wife is lovely and so is daughter. Wif helps him with dressing, he can get a little confused, [facility] not [facility] because it is harder for him to be independent, they have a recumbent bike. He is still a gentleman.   Reviewed notes, labs and imaging from outside physicians, which showed: see above for review of notes also:  Patient's wife is primary caregiver, memory unit nursing homes not necessary at this point according to his wife, B12 and thyroid has been checked in the past per notes last thyroid was normal, last CBC and CMP were unremarkable with BUN 22 and creatinine 1.22.    Review of Systems: Patient complains of symptoms per HPI as well as the following symptoms Alzheimers.. Pertinent negatives and positives per HPI. All others negative.   Social History   Socioeconomic History   Marital status: Married    Spouse name: Not on file   Number of children: Not on file   Years of education: Not on file   Highest education level: Not on file  Occupational History   Not on file  Tobacco Use   Smoking status: Former   Smokeless tobacco: Never  Vaping Use   Vaping status: Never Used  Substance and Sexual Activity   Alcohol use: Yes    Comment: social drinks wine   Drug use: No   Sexual activity: Not on file  Other Topics Concern   Not on file  Social History Narrative   Not on file   Social Determinants of Health   Financial Resource Strain: Not on file  Food Insecurity: Not on file  Transportation Needs: Not on file  Physical Activity: Not on file  Stress: Not on file  Social Connections: Not on file  Intimate Partner Violence: Not on file    Family History  Problem Relation Age of Onset   Heart attack Father 37   Cancer Mother    Cancer Brother     Past Medical History:   Diagnosis Date   Acute inferior myocardial infarction [facility])     Stent to right coronary on Sep 06, 2007 with 3.5 x 20 mm Liberte non-drug eluting stent   Alzheimer disease (HCC)    Arthritis    Coronary artery disease    Stent to prox RCA 2009 with cutting balloon to PDA   Dementia (HCC)    memory problems   GERD (gastroesophageal reflux disease)    History of tobacco abuse    Hyperlipidemia    Neuromuscular disorder [facility])     Patient Active Problem List   Diagnosis Date Noted   Chronic kidney disease, stage 3a (HCC) 01/07/2022   Diverticulitis of colon 01/07/2022   Essential hypertension 01/07/2022   Gastro-esophageal reflux disease without esophagitis 01/07/2022   Peripheral neuropathy 01/07/2022   Pure hypercholesterolemia 01/07/2022   Atherosclerotic heart disease of native coronary artery without angina pectoris 01/07/2022  Lumbar degenerative disc disease 01/05/2021   Chronic bilateral low back pain 01/05/2021   Gait abnormality 01/05/2021   Late onset Alzheimer's dementia without behavioral disturbance (HCC) 01/05/2021   Left foot drop 01/05/2021   Sensorineural hearing loss (SNHL), bilateral 05/15/2020   Late onset Alzheimer's disease without behavioral disturbance (HCC) 10/11/2016   HNP (herniated nucleus pulposus), lumbar 04/10/2015   PAD (peripheral artery disease) (HCC) 05/09/2014   CAD (coronary artery disease) 03/03/2011   Hyperlipidemia 03/03/2011    Past Surgical History:  Procedure Laterality Date   BACK SURGERY   February 2009   BELPHAROPTOSIS REPAIR     CARDIAC CATHETERIZATION  2009   LUMBAR LAMINECTOMY/DECOMPRESSION MICRODISCECTOMY Left 04/10/2015   Procedure: LUMBAR LAMINECTOMY/DECOMPRESSION MICRODISCECTOMY 1 LEVEL;  Surgeon: Shirlean Kelly, MD;  Location: [facility];  Service: Neurosurgery;  Laterality: Left;  Left L45 laminotomy   TONSILLECTOMY      Current Outpatient Medications  Medication Sig Dispense Refill   aspirin 81 MG tablet Take 81  mg by mouth daily.     co-enzyme Q-10 30 MG capsule Take 30 mg by mouth daily.     donepezil (ARICEPT) 10 MG tablet Take 1 tablet (10 mg total) by mouth 2 (two) times daily. 180 tablet 4   famotidine (PEPCID) 20 MG tablet Take 20 mg by mouth at bedtime as needed.     gabapentin (NEURONTIN) 600 MG tablet Take 600 mg by mouth 3 (three) times daily.     memantine (NAMENDA) 10 MG tablet TAKE 1 TABLET BY MOUTH TWICE A DAY 180 tablet 3   metoprolol tartrate (LOPRESSOR) 25 MG tablet Take 0.5 tablets (12.5 mg total) by mouth 2 (two) times daily. 90 tablet 0   nitroGLYCERIN (NITROSTAT) 0.4 MG SL tablet Place 1 tablet (0.4 mg total) under the tongue every 5 (five) minutes as needed for chest pain. 25 tablet 1   Omega-3 Fatty Acids (FISH OIL) 1000 MG CAPS Take 1,000 mg by mouth daily.     rosuvastatin (CRESTOR) 40 MG tablet Take 40 mg by mouth daily. (Patient not taking: Reported on 02/15/2022)     No current facility-administered medications for this visit.    Allergies as of 01/12/2023   (No Known Allergies)    Vitals: There were no vitals taken for this visit. Last Weight:  Wt Readings from Last 1 Encounters:  02/15/22 154 lb 3.2 oz (69.9 kg)   Last Height:   Ht Readings from Last 1 Encounters:  02/15/22 5\' 7"  (1.702 m)     Physical exam: Exam: Gen: NAD, conversant, well nourised, well groomed                     CV: RRR, no MRG. No Carotid Bruits. No peripheral edema, warm, nontender Eyes: Conjunctivae clear without exudates or hemorrhage  Neuro: Detailed Neurologic Exam  Speech:    Speech is normal; fluent  Cognition:     01/01/2021   11:37 AM  MMSE - Mini Mental State Exam  Orientation to time 1  Orientation to Place 1  Registration 3  Attention/ Calculation 0  Recall 0  Language- name 2 objects 2  Language- repeat 1  Language- follow 3 step command 3  Language- read & follow direction 0  Write a sentence 0  Copy design 0  Total score 11    Cranial Nerves:     The pupils are equal, round, and reactive to light. Pupils too small to visualize fundi.  Visual fields are full to threat.  Extraocular movements are intact. Trigeminal sensation is intact and the muscles of mastication are normal. The face is symmetric. The palate elevates in the midline. Hearing impaired. Voice is normal. Shoulder shrug is normal. The tongue has normal motion without fasciculations.   Coordination:    No dysmetria or ataxia noted   Gait:    Steppage gait bilat on left  Motor Observation:    No asymmetry, no atrophy, and no involuntary movements noted. Tone:    Normal muscle tone.    Posture:    Posture is slightly stooped    Strength:    Left foot drop     Sensation: intact to LT     Reflex Exam:  DTR's:    Deep tendon reflexes in the upper and lower extremities are symmetrical bilaterally.   Toes:    The toes are equivocal bilaterally.  Left upgoing?  Clonus:    Clonus is absent.    Assessment/Plan: Patient with Alzheimer's, diagnosed 2017 which was his initial evaluation at [facility], most likely Alzheimer's of moderate severity, he was started on Aricept at that time, Candiss Norse was added in 2019 he was followed by Dr. Clarene Duke in the past who was recently retired, now seeing Dr. Tenny Craw, diagnosed with late onset Alzheimer's disease without behavioral disturbance he also follows with [facility] and the last time he was seen surgery was not suggested due to his dementia and surgery could have a significant impact on his cognitive status.   - diagnosed with late onset alzheimers, wife is his primary caregiver, they still socialize often, there are no behavioral problems, they have a lot of support, still in their house of >36 years, good neighbors, and a wonderful daughter and son. Patient always has a great sense of humor, no behavioral issues,  - last year referred to aquatic physical therapy at the [facility]  - Left foot drop,  referred to orthotics, doesn't wear it  - continue aricept and namenda current doses, had a difficult time with insurance, was eventually approved with [place], been on 10mg  BID and doing well for years, no side effects  - discussedThe "XX BRAIN" Chana Bode with daughter and the MIND Diet [employer]   No orders of the defined types were placed in this encounter.    Cc: Daisy Floro, MD,  Daisy Floro, MD  Naomie Dean, MD  [facility] [address] [place], [place] [address]  Phone [phone] Fax [phone]  I spent over 60  minutes of face-to-face and non-face-to-face time with patient on the  No diagnosis found.   diagnosis.  This included previsit chart review, lab review, study review, order entry, electronic health record documentation, patient education on the different diagnostic and therapeutic options, counseling and coordination of care, risks and benefits of management, compliance, or risk factor reduction

## 2023-02-02 DIAGNOSIS — N3941 Urge incontinence: Secondary | ICD-10-CM | POA: Diagnosis not present

## 2023-02-04 ENCOUNTER — Other Ambulatory Visit: Payer: Self-pay | Admitting: Neurology

## 2023-02-27 ENCOUNTER — Encounter: Payer: Self-pay | Admitting: Cardiovascular Disease

## 2023-02-27 NOTE — Progress Notes (Unsigned)
Alexander [name] Date of Birth  July 25, 1940   [address]    [address] Alexander [name], [place]  [address] [phone]  Fax  [phone]  Problem List 1. CAD- s/p stenting 2009 Alexander [name])  2. Hyperlipidemia 3. Peripheral Vascular  disease  4. Peripheral neuropathy  5. alzheimers Disease    Alexander [name] is a 82 year old gentleman with a history of coronary artery disease. Status post inferior wall myocardial infarction in 2009. He had a stent placed at that time. He is also status post cutting balloon procedure in 2009.  Has a history of hyperlipidemia.  He's done very well. He works out at [employer] on a regular basis. He's not had any episodes of chest pain or shortness of breath.  He still works out at [employer].   Sep 18, 2012:  Still working out at [employer].   He is having some memory problems and wants to decrease the dose of crestor.   Nov. 17, 2014:  Doing well, having some sinus problems.  No CP.   Still going to [facility] 5 days a week.   His most recent lipid profile had an LDL of 103 - he had tried to decrease his crestor to 10.  He is back on 20 mg a day.  October 05, 2013: Alexander [name] is a doing very well. He's a former patient of Dr. Deborah [name].   He goes to the [facility]   Jan. 7, 2016: Alexander [name] is a 82 yo with hx of CAD and hyperlipidemia. Alexander [name] is not doing as well today.  Is having some back pain.  He recently saw Dr. Deborah [name] at the [facility] .  He having some control issues with his right leg.    December 30, 2014:  Has some shortness of breath with exertion.  Very short of breath when he climbs 2 flights of stairs Has had some leg pain , wakes him up at night. Has known PVD   July 03, 2015  Alexander [name] had some shortness of breath .   Had an echo card gram revealed normal left ventricle systolic function. He did have mild diastolic dysfunction. Breathing is much better, not on any new meds.  Still goes to the [facility] and walks in the [facility] ( shallow [place] of the pool) Had back surgery Dec. 8, 2016  - had  lost all strength in his left leg  Has neuropathy in both feet.   BP and HR look great today   Oct. 2, 2017:  Was seen with his wife  Alexander [name]. No CP or dyspnea. Goes to the [facility] 5 days a week.   Was diagnosed with Alzheimers this past week,   Started Aricept last night   July 26, 2016:  Doing well.   Still goes to the [facility]  5 times a week ( goes at 6 AM every day )  Was seen with his wife  Alexander [name]. No CP or dyspnea Recent labs at Dr. Clarene Duke - [facility]    Jan. 15, 2019:  doing well.   No CP , Was seen with his wife  Alexander [name]. Goes to the [facility] 5 days a week - 3 times a week for water aerobics and 2 days on the bike .  Dr. Clarene Duke manages his chol.   January 30, 2018: [name] is seen today for follow-up of his coronary artery disease. BP is a bit lower than usual . No syncope or presyncope  Does not eat much salt .  Still going to  the [facility] 5 day days a week  No CP or dyspnea   With working out.   BP is low,   Will reduce his metoprolol      Aug. 28, 2020   Alexander [name] is seen today for follow up of his CAD , HLD, PVD. He also has some alzheimers disease Just restarted his water aerobics .   Aug. 30, 2021:  BP is low today  No symptoms  Is eating well .  No exercising , unable to exercise  No angina ,  Hx of CAD, HLD ,   Aug. 29, 2022:   Alexander [name] is seen today for follow up of his CAD  Progressive dementia  Seen with wife, Alexander [name]  Dealing with water damage from a broken washing machine  Oct. 16, 2023  Alexander [name] is seen for follow up of his CAD  Progressive dementia   Feeling well .  Uses a recumbant bike every day . No CP or dyspnea  Wife Alexander [name] did most of the talking    Oct. 28, 2024  Alexander [name] is seen for follow up of his CAD Has progressive dementia  Seen with wife Alexander [name]   No CP , no dyspnea   [address] Is losing weight  Alexander [name] will discuss with his primary MD   He had an episode of generalized weakness Wife said his color was not good   She thought he was having a stroke  Possible TIA     Current Outpatient Medications on File Prior to Visit  Medication Sig Dispense Refill   aspirin 81 MG tablet Take 81 mg by mouth daily.     co-enzyme Q-10 30 MG capsule Take 30 mg by mouth daily.     donepezil (ARICEPT) 10 MG tablet Take 1 tablet (10 mg total) by mouth 2 (two) times daily. 180 tablet 4   famotidine (PEPCID) 20 MG tablet Take 20 mg by mouth at bedtime as needed.     gabapentin (NEURONTIN) 600 MG tablet Take 600 mg by mouth 3 (three) times daily.     memantine (NAMENDA) 10 MG tablet TAKE 1 TABLET BY MOUTH TWICE A DAY 180 tablet 0   metoprolol tartrate (LOPRESSOR) 25 MG tablet Take 0.5 tablets (12.5 mg total) by mouth 2 (two) times daily. 90 tablet 0   nitroGLYCERIN (NITROSTAT) 0.4 MG SL tablet Place 1 tablet (0.4 mg total) under the tongue every 5 (five) minutes as needed for chest pain. 25 tablet 1   Omega-3 Fatty Acids (FISH OIL) 1000 MG CAPS Take 1,000 mg by mouth daily.     rosuvastatin (CRESTOR) 40 MG tablet Take 40 mg by mouth daily. (Patient not taking: Reported on 02/15/2022)     No current facility-administered medications on file prior to visit.    No Known Allergies  Past Medical History:  Diagnosis Date   Acute inferior myocardial infarction [facility])     Stent to right coronary on Sep 06, 2007 with 3.5 x 20 mm Liberte non-drug eluting stent   Alzheimer disease (HCC)    Arthritis    Coronary artery disease    Stent to prox RCA 2009 with cutting balloon to PDA   Dementia [facility])    memory problems   GERD (gastroesophageal reflux disease)    History of tobacco abuse    Hyperlipidemia    Neuromuscular disorder [facility])     Past Surgical History:  Procedure Laterality Date   BACK SURGERY   February 2009   BELPHAROPTOSIS REPAIR  CARDIAC CATHETERIZATION  2009   LUMBAR LAMINECTOMY/DECOMPRESSION MICRODISCECTOMY Left 04/10/2015   Procedure: LUMBAR LAMINECTOMY/DECOMPRESSION MICRODISCECTOMY 1 LEVEL;  Surgeon:  Shirlean Kelly, MD;  Location: [facility];  Service: Neurosurgery;  Laterality: Left;  Left L45 laminotomy   TONSILLECTOMY      Social History   Tobacco Use  Smoking Status Former  Smokeless Tobacco Never    Social History   Substance and Sexual Activity  Alcohol Use Yes   Comment: social drinks wine    Family History  Problem Relation Age of Onset   Heart attack Father 78   Cancer Mother    Cancer Brother     Reviw of Systems:  Noted in current history, all other systems are negative.     Physical Exam: Blood pressure (!) 108/56, pulse (!) 57, height 5\' 6"  (1.676 m), weight 133 lb 3.2 oz (60.4 kg), SpO2 97%.       GEN:  Well nourished, well developed in no acute distress HEENT: Normal NECK: No JVD; No carotid bruits LYMPHATICS: No lymphadenopathy CARDIAC: RRR , no murmurs, rubs, gallops RESPIRATORY:  Clear to auscultation without rales, wheezing or rhonchi  ABDOMEN: Soft, non-tender, non-distended MUSCULOSKELETAL:  No edema; No deformity  SKIN: Warm and dry NEUROLOGIC:  Alert and oriented x 3    EKG:    EKG Interpretation Date/Time:  Monday February 28 2023 14:38:08 EDT Ventricular Rate:  58 PR Interval:  164 QRS Duration:  86 QT Interval:  454 QTC Calculation: 445 R Axis:   41  Text Interpretation: Sinus bradycardia When compared with ECG of 06-Aug-2019 11:52, No significant change since last tracing Confirmed by Kristeen Miss ([id number]) on 02/28/2023 2:53:40 PM      Assessment / Plan:   1. CAD- s/p stenting 2009 ( [facility])     No angina. He is becoming more debilatated.   He is not a good candidate for any major cardiac evaluations.   He is not a candidate for any complex  invasive cardiac procedures   2. Hyperlipidemia -      Stable , continue current meds.     3. Peripheral vascular disease:    4. Peripheral neuropathy :    5.  Alzheimers Disease -  progressive dementia    Kristeen Miss, MD  02/28/2023 2:54 PM    [facility] [address] [place],  [address] [place], [place]  [address] Pager [phone] Phone: [phone]; Fax: [phone]

## 2023-02-28 ENCOUNTER — Ambulatory Visit: Payer: Medicare HMO | Attending: Cardiovascular Disease | Admitting: Cardiovascular Disease

## 2023-02-28 VITALS — BP 108/56 | HR 57 | Ht 66.0 in | Wt 133.2 lb

## 2023-02-28 DIAGNOSIS — I2510 Atherosclerotic heart disease of native coronary artery without angina pectoris: Secondary | ICD-10-CM

## 2023-02-28 DIAGNOSIS — E782 Mixed hyperlipidemia: Secondary | ICD-10-CM | POA: Diagnosis not present

## 2023-02-28 NOTE — Patient Instructions (Signed)
Medication Instructions:  STOP Metoprolol *If you need a refill on your cardiac medications before your next appointment, please call your pharmacy*  Lab Work: Lipids, ALT, BMET today If you have labs (blood work) drawn today and your tests are completely normal, you will receive your results only by: MyChart Message (if you have MyChart) OR A paper copy in the mail If you have any lab test that is abnormal or we need to change your treatment, we will call you to review the results.  Follow-Up: At [facility], you and your health needs are our priority.  As part of our continuing mission to provide you with exceptional heart care, we have created designated Provider Care Teams.  These Care Teams include your primary Cardiologist (physician) and Advanced Practice Providers (APPs -  Physician Assistants and Nurse Practitioners) who all work together to provide you with the care you need, when you need it.  Your next appointment:   1 year(s)  Provider:   Chilton Si, MD

## 2023-03-01 LAB — BASIC METABOLIC PANEL
BUN/Creatinine Ratio: 14 (ref 10–24)
BUN: 14 mg/dL (ref 8–27)
CO2: 22 mmol/L (ref 20–29)
Calcium: 9.4 mg/dL (ref 8.6–10.2)
Chloride: 103 mmol/L (ref 96–106)
Creatinine, Ser: 1.03 mg/dL (ref 0.76–1.27)
Glucose: 99 mg/dL (ref 70–99)
Potassium: 4.1 mmol/L (ref 3.5–5.2)
Sodium: 143 mmol/L (ref 134–144)
eGFR: 73 mL/min/{1.73_m2} (ref >59)

## 2023-03-01 LAB — LIPID PANEL
Chol/HDL Ratio: 2.8 ratio (ref 0.0–5.0)
Cholesterol, Total: 140 mg/dL (ref 100–199)
HDL: 50 mg/dL (ref >39)
LDL Chol Calc (NIH): 69 mg/dL (ref 0–99)
Triglycerides: 114 mg/dL (ref 0–149)
VLDL Cholesterol Cal: 21 mg/dL (ref 5–40)

## 2023-03-01 LAB — ALT: ALT: 57 [IU]/L — ABNORMAL HIGH (ref 0–44)

## 2023-03-02 ENCOUNTER — Other Ambulatory Visit (HOSPITAL_COMMUNITY): Payer: Self-pay

## 2023-03-02 ENCOUNTER — Telehealth: Payer: Self-pay

## 2023-03-02 DIAGNOSIS — E782 Mixed hyperlipidemia: Secondary | ICD-10-CM

## 2023-03-02 DIAGNOSIS — I2510 Atherosclerotic heart disease of native coronary artery without angina pectoris: Secondary | ICD-10-CM

## 2023-03-02 MED ORDER — ROSUVASTATIN CALCIUM 10 MG PO TABS: 10.0000 mg | ORAL_TABLET | Freq: Every day | ORAL | 3 refills | Status: AC

## 2023-03-02 MED ORDER — ROSUVASTATIN CALCIUM 10 MG PO TABS
10.0000 mg | ORAL_TABLET | Freq: Every day | ORAL | 3 refills | Status: DC
  Filled 2023-03-02: qty 90, 90d supply, fill #0

## 2023-03-02 NOTE — Telephone Encounter (Signed)
Called and spoke with wife who verbalized understanding. New prescription sent to pharmacy since pt's current tablets are 40mg  and will be difficult to quarter. Repeat labs ordered. Pt's wife states she will bring him in December.

## 2023-03-02 NOTE — Addendum Note (Signed)
Addended by: Lars Mage on: 03/02/2023 04:58 PM   Modules accepted: Orders

## 2023-03-02 NOTE — Telephone Encounter (Signed)
-----   Message from Kristeen Miss sent at 03/01/2023  8:56 PM EDT ----- ALT is elevated  Lets reduce his rosuvastatin to 10 mg a day and recheck ALT in 3 months  Lipids look good   He has severe dementia, and neuropathy He is not a candidate for invasive procedures

## 2023-03-03 DIAGNOSIS — Z961 Presence of intraocular lens: Secondary | ICD-10-CM | POA: Diagnosis not present

## 2023-03-03 DIAGNOSIS — H26493 Other secondary cataract, bilateral: Secondary | ICD-10-CM | POA: Diagnosis not present

## 2023-05-11 ENCOUNTER — Other Ambulatory Visit: Payer: Self-pay | Admitting: Neurology

## 2023-05-31 DIAGNOSIS — Z0000 Encounter for general adult medical examination without abnormal findings: Secondary | ICD-10-CM | POA: Diagnosis not present

## 2023-05-31 DIAGNOSIS — Z87448 Personal history of other diseases of urinary system: Secondary | ICD-10-CM | POA: Diagnosis not present

## 2023-05-31 DIAGNOSIS — Z23 Encounter for immunization: Secondary | ICD-10-CM | POA: Diagnosis not present

## 2023-05-31 DIAGNOSIS — K219 Gastro-esophageal reflux disease without esophagitis: Secondary | ICD-10-CM | POA: Diagnosis not present

## 2023-05-31 DIAGNOSIS — E46 Unspecified protein-calorie malnutrition: Secondary | ICD-10-CM | POA: Diagnosis not present

## 2023-05-31 DIAGNOSIS — M21372 Foot drop, left foot: Secondary | ICD-10-CM | POA: Diagnosis not present

## 2023-05-31 DIAGNOSIS — I10 Essential (primary) hypertension: Secondary | ICD-10-CM | POA: Diagnosis not present

## 2023-05-31 DIAGNOSIS — G629 Polyneuropathy, unspecified: Secondary | ICD-10-CM | POA: Diagnosis not present

## 2023-05-31 DIAGNOSIS — G301 Alzheimer's disease with late onset: Secondary | ICD-10-CM | POA: Diagnosis not present

## 2023-05-31 DIAGNOSIS — E7800 Pure hypercholesterolemia, unspecified: Secondary | ICD-10-CM | POA: Diagnosis not present

## 2023-05-31 DIAGNOSIS — Z681 Body mass index (BMI) 19 or less, adult: Secondary | ICD-10-CM | POA: Diagnosis not present

## 2023-06-24 ENCOUNTER — Ambulatory Visit: Payer: Medicare HMO | Admitting: Neurology

## 2023-07-14 ENCOUNTER — Encounter: Payer: Self-pay | Admitting: Neurology

## 2023-07-14 ENCOUNTER — Ambulatory Visit: Admitting: Neurology

## 2023-07-14 VITALS — BP 117/43 | HR 61 | Wt 129.0 lb

## 2023-07-14 DIAGNOSIS — G253 Myoclonus: Secondary | ICD-10-CM

## 2023-07-14 DIAGNOSIS — R1310 Dysphagia, unspecified: Secondary | ICD-10-CM

## 2023-07-14 NOTE — Progress Notes (Signed)
 [facility]    Provider:  Dr Lucia Gaskins Requesting Provider: Daisy Floro, MD Primary Care Provider:  Daisy Floro, MD  CC:  alzheimer disease, several days of jerking  07/14/2023: Here for a new problem jerking and loss of muscle control, having weakness and spasms in the legs, shoulders, arms and hands, he does have neuropathy in the feet and gets around with a walker and stair lift. On exam saw mild jerking with gripping of both hands. Here with wife and daughter who provide all information. Not occurring anymore but happened last week. Last Wednesday a little over a week ago she noticed 2 glasses of liquids. Wife noticed the left hand had a muscle spasm he picked up the juice and it went "flying" it was dramatic. It was in the houlders, brief, repetitive, both sides he would shirk one shoulder then the next, would kick both legs one after the other separate. It was acute that day. He has had dental issues and was going back to the dentist, he had had apple sauce and got choked on ensure, he is having problems swallowing, his coloring was off and speech was not clear, EMS came and and his vitals were good, he could color 45 minutes later and speech was back to baseline. He had an affect change. He has had spasms at night for some time but not constant 2 months. He also had a headache. Video looks like myoclonus. No new medications, he was at baseline. Since then resolved.  Riht leg with fasciculations and bilateral upper prox arms with fascics. Reflexes brisk left biceps otherwise not.   Patient complains of symptoms per HPI as well as the following symptoms: advanced dementia . Pertinent negatives and positives per HPI. All others negative  01/06/2022: Patient is here today with his wife who provides all information he is pleasant, he is "progressing gracefully", daughter Alexander [name] could not be here with [place] today, Alexander [name] [phone].  Per wife, there have been no  significant changes, he eats well, he sleeps well, she feels very blessed, there have been no falls, he is eating well, we discussed he has been on twice a day Aricept for many years, last year we had a difficult time getting insurance to approve 10 mg twice a day, however wife felt very strongly she did not want to change anything since he is done so well, usually we do only recommend 10 mg but Aricept is approved for 23 mg daily, no side effects to the high dose of aricept, culd not complete MMSE (last 11/30).  Patient is very pleasant today, he still has a very good sense of humor, wife is lovely, they have a wonderful daughter who could not make it here today with them.  I will give daughter a call or text just to check in.  We discussed dementia today, lovely wife, feels blessed, absolutely phenomenal family.  Lastly we discussed Lecanumab, at this late stage he would not be a candidate, but we do not know what the future holds.  No other focal neurologic deficits, associated symptoms, inciting events or modifiable factors.  Patient complains of symptoms per HPI as well as the following symptoms: dementia . Pertinent negatives and positives per HPI. All others negative   HPI:  Alexander [name] is a 83 y.o. male here as requested by Daisy Floro, MD for alzheimer disease.  Past medical history intervertebral disc degeneration lumbar region, Alzheimer's disease with late onset, coronary artery disease and a  history of MI, peripheral vascular disease, hypertension, hypercholesterolemia.  I reviewed Dr. Charlott Rakes notes, patient is already on Aricept and memantine, he has already been seen by [facility] and I reviewed their notes as well, it appears as though he has been doing well since seeing [facility], I reviewed Neurology notes as wee: Candiss Norse was added in January 2019 at that time his MoCA was a 19 out of 30, no signs of agitation irritation or anger, they continue to go to  the [facility] 5 days a week, he attends water aerobics class, no trouble managing himself inside and out of the locker room, he still attends the [employer] and meetings every month, they have a close group of friends have known for greater than 40 years and they continue to socialize, he has no delusions, hallucinations, agitation, depression, anxiety, apathy disinhibition and irritability no disturbance nighttime behaviors changes in appetite or eating, his neurologic and physical exam appear relatively unremarkable, diagnosed with late onset Alzheimer's disease without behavioral disturbance, initial evaluation was on September 2017 with dementia most likely Alzheimer's of moderate severity and that is when he started the Aricept.  The whole family is here, wife and daughter provide most information, diagnosed with late onset alzheimers, wife is his primary caregiver, they still socialize often, there are no behavioral problems, they have a lot of support, still in their house of 36 years, good neighbors, and a wonderful daughter and son. Patient always has a great sense of humor, no behavioral issues, no sleeping issues, he will get up and in the middle of the night to urinate but thee is a light on, no snoring, he naps a little during the day but sleeps well as night, he has had 3 back surgeries and he is on Gabapentin, they used to go to Dr. Ollen Bowl but they don;t need any injections and we discussed the new pain management doctor at [facility] should he need, he is not hurting, the gabapentin may make him more tired but it helps with pain. He struglles with his words. Appetite is good, no falls, hydration is good, getting out socially. No significant caregiver stress, wife is lovely and so is daughter. Wif helps him with dressing, he can get a little confused, [facility] because it is harder for him to be independent, they have a recumbent bike. He is still a gentleman.    Reviewed notes, labs and imaging from outside physicians, which showed: see above for review of notes also:  Patient's wife is primary caregiver, memory unit nursing homes not necessary at this point according to his wife, B12 and thyroid has been checked in the past per notes last thyroid was normal, last CBC and CMP were unremarkable with BUN 22 and creatinine 1.22.    Review of Systems: Patient complains of symptoms per HPI as well as the following symptoms Alzheimers.. Pertinent negatives and positives per HPI. All others negative.   Social History   Socioeconomic History   Marital status: Married    Spouse name: Not on file   Number of children: Not on file   Years of education: Not on file   Highest education level: Not on file  Occupational History   Not on file  Tobacco Use   Smoking status: Former   Smokeless tobacco: Never  Vaping Use   Vaping status: Never Used  Substance and Sexual Activity   Alcohol use: Yes  Comment: social drinks wine   Drug use: No   Sexual activity: Not on file  Other Topics Concern   Not on file  Social History Narrative   Not on file   Social Drivers of Health   Financial Resource Strain: Not on file  Food Insecurity: Not on file  Transportation Needs: Not on file  Physical Activity: Not on file  Stress: Not on file  Social Connections: Not on file  Intimate Partner Violence: Not on file    Family History  Problem Relation Age of Onset   Heart attack Father 36   Cancer Mother    Cancer Brother     Past Medical History:  Diagnosis Date   Acute inferior myocardial infarction [facility])     Stent to right coronary on Sep 06, 2007 with 3.5 x 20 mm Liberte non-drug eluting stent   Alzheimer disease (HCC)    Arthritis    Coronary artery disease    Stent to prox RCA 2009 with cutting balloon to PDA   Dementia (HCC)    memory problems   GERD (gastroesophageal reflux disease)    History of tobacco abuse    Hyperlipidemia     Neuromuscular disorder [facility])     Patient Active Problem List   Diagnosis Date Noted   Chronic kidney disease, stage 3a (HCC) 01/07/2022   Diverticulitis of colon 01/07/2022   Essential hypertension 01/07/2022   Gastro-esophageal reflux disease without esophagitis 01/07/2022   Peripheral neuropathy 01/07/2022   Pure hypercholesterolemia 01/07/2022   Atherosclerotic heart disease of native coronary artery without angina pectoris 01/07/2022   Lumbar degenerative disc disease 01/05/2021   Chronic bilateral low back pain 01/05/2021   Gait abnormality 01/05/2021   Late onset Alzheimer's dementia without behavioral disturbance (HCC) 01/05/2021   Left foot drop 01/05/2021   Sensorineural hearing loss (SNHL), bilateral 05/15/2020   Late onset Alzheimer's disease without behavioral disturbance (HCC) 10/11/2016   HNP (herniated nucleus pulposus), lumbar 04/10/2015   PAD (peripheral artery disease) (HCC) 05/09/2014   CAD (coronary artery disease) 03/03/2011   Hyperlipidemia 03/03/2011    Past Surgical History:  Procedure Laterality Date   BACK SURGERY   February 2009   BELPHAROPTOSIS REPAIR     CARDIAC CATHETERIZATION  2009   LUMBAR LAMINECTOMY/DECOMPRESSION MICRODISCECTOMY Left 04/10/2015   Procedure: LUMBAR LAMINECTOMY/DECOMPRESSION MICRODISCECTOMY 1 LEVEL;  Surgeon: Shirlean Kelly, MD;  Location: [facility];  Service: Neurosurgery;  Laterality: Left;  Left L45 laminotomy   TONSILLECTOMY      Current Outpatient Medications  Medication Sig Dispense Refill   aspirin 81 MG tablet Take 81 mg by mouth daily.     co-enzyme Q-10 30 MG capsule Take 30 mg by mouth daily.     donepezil (ARICEPT) 10 MG tablet TAKE 1 TABLET BY MOUTH TWICE A DAY 180 tablet 4   famotidine (PEPCID) 20 MG tablet Take 20 mg by mouth at bedtime as needed.     gabapentin (NEURONTIN) 600 MG tablet Take 600 mg by mouth 3 (three) times daily.     memantine (NAMENDA) 10 MG tablet TAKE 1 TABLET BY MOUTH TWICE A DAY 180  tablet 4   metoprolol tartrate (LOPRESSOR) 25 MG tablet Take 0.5 mg by mouth 2 (two) times daily.     nitroGLYCERIN (NITROSTAT) 0.4 MG SL tablet Place 1 tablet (0.4 mg total) under the tongue every 5 (five) minutes as needed for chest pain. 25 tablet 1   Omega-3 Fatty Acids (FISH OIL) 1000 MG CAPS Take 1,000 mg  by mouth daily.     rosuvastatin (CRESTOR) 10 MG tablet Take 1 tablet (10 mg total) by mouth daily. (Patient not taking: Reported on 07/14/2023) 90 tablet 3   No current facility-administered medications for this visit.    Allergies as of 07/14/2023 - Review Complete 07/14/2023  Allergen Reaction Noted   Covid-19 mrna vacc (moderna)  07/14/2023    Vitals: BP (!) 117/43 (BP Location: Left Arm, Patient Position: Sitting, Cuff Size: Small)   Pulse 61   Wt 129 lb (58.5 kg)   BMI 20.82 kg/m  Last Weight:  Wt Readings from Last 1 Encounters:  07/14/23 129 lb (58.5 kg)   Last Height:   Ht Readings from Last 1 Encounters:  02/28/23 5\' 6"  (1.676 m)   Exam: NAD, pleasant                  Speech:    Non verbal but pleasant Cognition:    01/01/2021   11:37 AM  MMSE - Mini Mental State Exam  Orientation to time 1  Orientation to Place 1  Registration 3  Attention/ Calculation 0  Recall 0  Language- name 2 objects 2  Language- repeat 1  Language- follow 3 step command 3  Language- read & follow direction 0  Write a sentence 0  Copy design 0  Total score 11      Cranial Nerves:    The pupils are equal, round, and reactive to light.Trigeminal sensation is intact and the muscles of mastication are normal. The face is symmetric. The palate elevates in the midline. Voice is normal. Shoulder shrug is normal. The tongue has normal motion without fasciculations.   Coordination:  No dysmetria  Motor Observation:    +fasciculations in the right leg and bilateral upper arms. Tone:    Normal muscle tone.     Strength:    Strength is anti-gravity in the upper and lower limbs.       Sensation: intact to LT     Assessment/Plan: Patient with Alzheimer's, diagnosed 2017 which was his initial evaluation at [facility], most likely Alzheimer's of late-stage severity. Here for jerking movements for several days now resolved.  Here for myoclonus and also fasciculations on exam.   Can decrease the Gabapentin and check a level (can cause myoclonus) Could consider MRi of the brain, cervical spine and EEG but at this time any results would not change clinical management ad family agrees they do not want any any testing since resolved Fasciculations: Can be seen in motor nerve damage, ALS, can be benign - monitor Myoclonus can also be seen in many conditions including epilepsy, side effects of many medications, metabolic conditions, even dementia.  At this time we will just monitor.  Swallowing: Swallow Study - [place]  Orders Placed This Encounter  Procedures   DG OP Swallowing Func-Medicare/Speech Path   Gabapentin level   SLP modified barium swallow   Decrease the Gabapentin to 1/2 pill 3x a day can do it at one time or decrease one dose at a time and wait a few days in between Decrease aricept to 1/2 tablet for 1-2 weeks and then can stop and wait a few weeks  then Memantine can decrease to one pill and wait for 1-2 weeks then stop   - diagnosed with late onset alzheimers, wife is his primary caregiver, they still socialize often, there are no behavioral problems, they have a lot of support, still in their house of >36 years, good neighbors,  and a wonderful daughter and son. Patient always has a great sense of humor, no behavioral issues,  No orders of the defined types were placed in this encounter.    Cc: Daisy Floro, MD,  Daisy Floro, MD  Naomie Dean, MD  [facility] [address] [place], [place] [address]  Phone [phone] Fax [phone]  I spent 45 minutes of face-to-face and non-face-to-face  time with patient on the  1. Dysphagia, unspecified type   2. Myoclonus    diagnosis.  This included previsit chart review, lab review, study review, order entry, electronic health record documentation, patient education on the different diagnostic and therapeutic options, counseling and coordination of care, risks and benefits of management, compliance, or risk factor reduction

## 2023-07-14 NOTE — Patient Instructions (Addendum)
 Can decrease the Gabapentin and check a level (can cause myoclonus) Could consider MRi of the brain, cervical spine and EEG but at this time would not change clinical management Fasciculations: Can be seen in motor nerve damage, ALS, can be benign - monitor Swallowing: Swallow Study - [place]  Decrease the Gabapentin to 1/2 pill 3x a day can do it at one time or decrease one dose at a time and wait a few days in between Decrease aricept to 1/2 tablet for 1-2 weeks and then can stop and wait a few weeks  then Memantine can decrease to one pill and wait for 1-2 weeks then stop   Definition: Fasciculations are involuntary, brief muscle contractions that can be visible or felt. They occur when a single motor unit, which consists of a motor neuron and the muscle fibers it innervates, fires spontaneously.  Causes:  Fasciculations can have a wide range of causes, both benign and serious. Some common causes include:  Benign fasciculation syndrome: A condition characterized by persistent, unexplained fasciculations.  Stress or anxiety: Emotional stress can trigger fasciculations.  Caffeine or nicotine: These substances can stimulate the nervous system and cause twitching.  Electrolyte imbalances: Low levels of potassium or magnesium can lead to fasciculations.  Medications: Certain medications, such as corticosteroids and statins, can cause fasciculations as a side effect.  Neurological disorders: Fasciculations can be a symptom of underlying neurological conditions such as multiple sclerosis, amyotrophic lateral sclerosis (ALS), and spinal cord injuries.  Symptoms:  Fasciculations typically present as small, localized twitches that can occur anywhere in the body. They may be visible or only felt by the individual. They can occur intermittently or persistently.  Diagnosis:  To diagnose fasciculations, a doctor will typically perform a physical exam and ask about the patient's medical history. They  may also order tests such as an electromyography (EMG) to assess the electrical activity of the muscles.  Treatment: Treatment for fasciculations depends on the underlying cause. For benign fasciculation syndrome, no specific treatment is usually necessary. If the fasciculations are caused by an underlying medical condition, treatment for that condition may resolve the problem.  Outlook: Most cases of fasciculations are benign and resolve on their own. However, if they are persistent or accompanied by other symptoms such as weakness, numbness, or tingling, it is important to seek medical attention to rule out any underlying neurological disorders   Myoclonus Myoclonus is the sudden and quick movement of the muscles. It may include muscle jerks, twitches, or spasms that happen without a person's control (involuntary). There are different types of myoclonus. One type, called physiologic myoclonus, occurs normally in most people and rarely needs treatment. This type includes: Hiccups. Sleep starts, or twitching during sleep. Other types are caused by an underlying condition and may require treatment. What are the causes? The cause of this condition varies depending on the type of myoclonus. Physiologic myoclonus, such as hiccups, results from normal actions of the body. Other causes include: Genetic condition or disease. This causes a type of myoclonus called essential myoclonus. Epilepsy. This causes epileptic myoclonus. Other underlying conditions cause secondary or symptomatic myoclonus. These include: Side effects of certain medicines. Metabolic conditions. Head, brain, or spinal injuries. Stroke. Nervous system conditions such as dementia, Parkinson's disease, and Alzheimer's disease. Infections. Poisoning. Brain tumors. What are the signs or symptoms? The main symptom of this condition is sudden spasms, jerking, or uncontrollable movements. Symptoms may: Occur in only one area of the  body or over the entire body.  Come and go. Vary in intensity. Make it hard for you to eat, speak, or walk. How is this diagnosed?  This condition is diagnosed based on your medical history, a review of your symptoms, and a physical exam. You may also have tests, including: Electroencephalogram (EEG). This test checks the electrical activity of the brain. Electromyogram (EMG). This test measures electrical activity in the muscles. Imaging tests such as an MRI or CT scan. Lumbar puncture (spinal tap) to check spinal fluid for infection or inflammation. Blood tests. Urine tests. How is this treated? Treatment for this condition depends on the underlying condition and the severity of your symptoms. Treatment may include: Medicines, such as tranquilizer and anti-seizure medicines. Treating the underlying cause, such as having a tumor removed or taking antibiotic medicine for an infection. Injections of a substance called botulinum toxin. Physiologic myoclonus does not require treatment. Follow these instructions at home: Take over-the-counter and prescription medicines only as told by your health care provider. If you are taking tranquilizer or anti-seizure medicines, do not drive or use machinery until your body adjusts to the medicine. These medicines may cause drowsiness. Keep a journal of your symptoms and the things that seem to trigger them or make them worse. Also, try to identity the things that make them better. Share this information with your health care provider. Keep all follow-up visits. This is important. Where to find more information [employer] of Neurological Disorders and Stroke: [email or web address] Contact a health care provider if: You have severe pain and medicines do not help. You have problems taking your medicines. Your twitches, jerks, or spasms get worse. Get help right away if: You have a seizure. You have a reaction to your medicines, such as a rash,  trouble breathing, or swelling. You have trouble staying alert. These symptoms may be an emergency. Get help right away. Call 911. Do not wait to see if the symptoms will go away. Do not drive yourself to the hospital. Summary Myoclonus is the sudden and quick movement of the muscles. It may include muscle jerks, twitches, or spasms that happen without a person's control (involuntary). There are different types of myoclonus. Physiologic myoclonus occurs normally in most people and rarely needs treatment. Other types of myoclonus are caused by an underlying condition. Treatment for this condition depends on the underlying condition and the severity of your symptoms. If you are taking tranquilizer or anti-seizure medicines, do not drive or use machinery until your body adjusts to the medicine. These medicines may cause drowsiness. This information is not intended to replace advice given to you by your health care provider. Make sure you discuss any questions you have with your health care provider. Document Revised: 02/06/2021 Document Reviewed: 02/06/2021 Elsevier Patient Education  2024 [employer].

## 2023-07-15 LAB — GABAPENTIN LEVEL: Gabapentin Lvl: 13.4 ug/mL (ref 4.0–16.0)

## 2023-07-21 ENCOUNTER — Telehealth: Payer: Self-pay | Admitting: *Deleted

## 2023-07-21 NOTE — Telephone Encounter (Signed)
 Called patient's wife Alexander [name] (on [place]), and LVM (ok per DPR) advising her that Dr Lucia Gaskins said pt's Gabapentin level in his blood is normal but she still recommends that he see if his symptoms improve on the lower dose of Gabapentin, if not he can go back to the higher dose for pain. I did ask her however, to call us if she feels he needs the higher dose so we can be aware. I reiterated the instructions Dr Lucia Gaskins had given during the office visit re: gabapentin reduction (below) and asked her to call us if she has any questions. Left office number in message.   "Decrease the Gabapentin to 1/2 pill 3x a day can do it at one time or decrease one dose at a time and wait a few days in between "

## 2023-07-21 NOTE — Telephone Encounter (Signed)
-----   Message from Anson Fret sent at 07/18/2023  8:22 PM EDT ----- Gabapentin level is normal, still let's see if his symptoms improve on the lower dose if not you can go back to the higher dose for pain thank you

## 2023-08-01 ENCOUNTER — Ambulatory Visit (HOSPITAL_COMMUNITY)
Admission: RE | Admit: 2023-08-01 | Discharge: 2023-08-01 | Disposition: A | Discharge status: Home / Self Care | Source: Ambulatory Visit | Attending: *Deleted | Admitting: *Deleted

## 2023-08-01 DIAGNOSIS — I252 Old myocardial infarction: Secondary | ICD-10-CM | POA: Insufficient documentation

## 2023-08-01 DIAGNOSIS — R1310 Dysphagia, unspecified: Secondary | ICD-10-CM

## 2023-08-01 DIAGNOSIS — I739 Peripheral vascular disease, unspecified: Secondary | ICD-10-CM | POA: Insufficient documentation

## 2023-08-01 DIAGNOSIS — R1312 Dysphagia, oropharyngeal phase: Secondary | ICD-10-CM | POA: Diagnosis not present

## 2023-08-01 DIAGNOSIS — I10 Essential (primary) hypertension: Secondary | ICD-10-CM | POA: Insufficient documentation

## 2023-08-01 DIAGNOSIS — I2510 Atherosclerotic heart disease of native coronary artery without angina pectoris: Secondary | ICD-10-CM | POA: Insufficient documentation

## 2023-08-01 DIAGNOSIS — R1314 Dysphagia, pharyngoesophageal phase: Secondary | ICD-10-CM | POA: Diagnosis not present

## 2023-08-01 DIAGNOSIS — F0280 Dementia in other diseases classified elsewhere without behavioral disturbance: Secondary | ICD-10-CM | POA: Insufficient documentation

## 2023-08-01 DIAGNOSIS — G309 Alzheimer's disease, unspecified: Secondary | ICD-10-CM | POA: Diagnosis not present

## 2023-08-01 NOTE — Therapy (Signed)
 Modified Barium Swallow Study  Patient Details  Name: Alexander [name] MRN: [id number] Date of Birth: [birth year]  Today's Date: 08/01/2023  Modified Barium Swallow completed.  Full report located under Chart Review in the Imaging Section.  History of Present Illness Alexander [name] is an 83 y.o. male with PMH: Alzheimer's dementia, CAD, h/o MI, PVD, HTN. His spouse is his primary caregiver. he presented to this OP MBS secondary to family concerns of him having increased difficulty with PO intake, including holding PO's (especially meds) in his mouth, difficulty chewing (has had multiple dental extractions with instances of coughing, choking. Per spouse, patient tends to eat too much too quickly and she has to monitor this closely.   Clinical Impression Alexander [name] presents with an oropharyngeal and esophageal based dysphagia with likely impact from cognitive impairment. Swallow intiated at level of vallecular sinus with all tested boluses. Partial anterior hyoid movement and incomplete laryngeal vestibular closure observed but with complete epiglottic inversion, and complete laryngeal elevation. Majority of residuals (moderate amount) collected in the vallecular sinus but with mild amount in aryepiglottic folds and pyriform sinus. Vallecular pyriform sinus residuals were reduced with liquid sips, but never fully cleared. He exhibited good airway protection and no instances of penetration or aspiration observed. 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 Esophageal sweep revealed suspected retrograde movement and stasis of barium in distal esophagus. SLP provided spouse with education and IDDSI handouts for levels 5 and 6 PO consistencies. He may also benefit from [facility] SLP to reinforce dysphagia safety precautions and assist spouse with implementing  and managing them. Factors that may increase risk of adverse event in presence of aspiration Alexander [name] & Alexander [name] 2021): Reduced cognitive function;Poor general health and/or compromised immunity;Frail or deconditioned  Swallow Evaluation Recommendations Recommendations: PO diet PO Diet Recommendation: Dysphagia 2 (Finely chopped);Dysphagia 3 (Mechanical soft);Thin liquids (Level 0) Liquid Administration via: Cup;Straw Medication Administration: Crushed with puree Supervision: Full supervision/cueing for swallowing strategies Swallowing strategies  : Slow rate;Small bites/sips;Minimize environmental distractions;Follow solids with liquids Postural changes: Stay upright 30-60 min after meals;Position pt fully upright for meals     Angela Nevin, MA, CCC-SLP Speech Therapy

## 2023-08-10 ENCOUNTER — Ambulatory Visit: Payer: Medicare HMO | Admitting: Neurology

## 2023-11-29 DIAGNOSIS — G629 Polyneuropathy, unspecified: Secondary | ICD-10-CM | POA: Diagnosis not present

## 2023-11-29 DIAGNOSIS — L989 Disorder of the skin and subcutaneous tissue, unspecified: Secondary | ICD-10-CM | POA: Diagnosis not present

## 2023-11-29 DIAGNOSIS — M549 Dorsalgia, unspecified: Secondary | ICD-10-CM | POA: Diagnosis not present

## 2023-11-29 DIAGNOSIS — Z681 Body mass index (BMI) 19 or less, adult: Secondary | ICD-10-CM | POA: Diagnosis not present

## 2023-12-27 ENCOUNTER — Telehealth: Payer: Self-pay | Admitting: Neurology

## 2023-12-27 NOTE — Telephone Encounter (Signed)
 Jillian/Angel: I just saw this patient today, so you can cancel his appointment on 9/3 and block the spot for Ariana. I will contact the son to reschedule thankyou  Fyi ariana/mattie

## 2024-01-04 ENCOUNTER — Ambulatory Visit: Payer: Medicare HMO | Admitting: Neurology

## 2024-02-23 ENCOUNTER — Ambulatory Visit (HOSPITAL_BASED_OUTPATIENT_CLINIC_OR_DEPARTMENT_OTHER): Admitting: Cardiovascular Disease
